# Patient Record
Sex: Female | Born: 1970 | Race: Black or African American | Hispanic: No | Marital: Single | Smoking: Current some day smoker
Health system: Southern US, Community
[De-identification: ages and names within clinical notes are randomized; demographics above are authoritative.]

## PROBLEM LIST (undated history)

## (undated) ENCOUNTER — Emergency Department (HOSPITAL_COMMUNITY): Disposition: A | Discharge status: Home / Self Care | Payer: Self-pay

## (undated) DIAGNOSIS — R768 Other specified abnormal immunological findings in serum: Secondary | ICD-10-CM

## (undated) DIAGNOSIS — M3214 Glomerular disease in systemic lupus erythematosus: Secondary | ICD-10-CM

## (undated) DIAGNOSIS — Z992 Dependence on renal dialysis: Secondary | ICD-10-CM

## (undated) DIAGNOSIS — H02059 Trichiasis without entropian unspecified eye, unspecified eyelid: Secondary | ICD-10-CM

## (undated) DIAGNOSIS — H902 Conductive hearing loss, unspecified: Secondary | ICD-10-CM

## (undated) DIAGNOSIS — O1490 Unspecified pre-eclampsia, unspecified trimester: Secondary | ICD-10-CM

## (undated) HISTORY — DX: Glomerular disease in systemic lupus erythematosus: M32.14

## (undated) HISTORY — DX: Unspecified pre-eclampsia, unspecified trimester: O14.90

## (undated) HISTORY — DX: Trichiasis without entropion unspecified eye, unspecified eyelid: H02.059

## (undated) HISTORY — DX: Other specified abnormal immunological findings in serum: R76.8

## (undated) HISTORY — PX: OTHER SURGICAL HISTORY: SHX169

## (undated) HISTORY — DX: Dependence on renal dialysis: Z99.2

## (undated) HISTORY — DX: Conductive hearing loss, unspecified: H90.2

---

## 1997-06-01 HISTORY — PX: RENAL BIOPSY: SHX156

## 1997-07-02 DIAGNOSIS — M3214 Glomerular disease in systemic lupus erythematosus: Secondary | ICD-10-CM

## 1997-07-02 HISTORY — DX: Glomerular disease in systemic lupus erythematosus: M32.14

## 1997-08-30 ENCOUNTER — Other Ambulatory Visit: Admission: RE | Admit: 1997-08-30 | Discharge: 1997-08-30 | Payer: Self-pay | Admitting: *Deleted

## 1997-08-30 ENCOUNTER — Encounter (HOSPITAL_COMMUNITY): Admission: RE | Admit: 1997-08-30 | Discharge: 1997-11-28 | Payer: Self-pay | Admitting: Nephrology

## 1997-09-04 ENCOUNTER — Other Ambulatory Visit: Admission: RE | Admit: 1997-09-04 | Discharge: 1997-09-04 | Payer: Self-pay | Admitting: *Deleted

## 1997-09-06 ENCOUNTER — Other Ambulatory Visit: Admission: RE | Admit: 1997-09-06 | Discharge: 1997-09-06 | Payer: Self-pay | Admitting: *Deleted

## 1997-09-11 ENCOUNTER — Other Ambulatory Visit: Admission: RE | Admit: 1997-09-11 | Discharge: 1997-09-11 | Payer: Self-pay | Admitting: Nephrology

## 1997-09-13 ENCOUNTER — Other Ambulatory Visit: Admission: RE | Admit: 1997-09-13 | Discharge: 1997-09-13 | Payer: Self-pay | Admitting: *Deleted

## 1997-09-18 ENCOUNTER — Other Ambulatory Visit: Admission: RE | Admit: 1997-09-18 | Discharge: 1997-09-18 | Payer: Self-pay | Admitting: *Deleted

## 1997-09-29 ENCOUNTER — Other Ambulatory Visit: Admission: RE | Admit: 1997-09-29 | Discharge: 1997-09-29 | Payer: Self-pay | Admitting: *Deleted

## 1997-10-02 ENCOUNTER — Other Ambulatory Visit: Admission: RE | Admit: 1997-10-02 | Discharge: 1997-10-02 | Payer: Self-pay | Admitting: Nephrology

## 1997-10-16 ENCOUNTER — Other Ambulatory Visit: Admission: RE | Admit: 1997-10-16 | Discharge: 1997-10-16 | Payer: Self-pay | Admitting: Nephrology

## 1997-11-03 ENCOUNTER — Other Ambulatory Visit: Admission: RE | Admit: 1997-11-03 | Discharge: 1997-11-03 | Payer: Self-pay | Admitting: Nephrology

## 1997-12-04 ENCOUNTER — Other Ambulatory Visit: Admission: RE | Admit: 1997-12-04 | Discharge: 1997-12-04 | Payer: Self-pay | Admitting: *Deleted

## 1997-12-12 ENCOUNTER — Ambulatory Visit (HOSPITAL_COMMUNITY): Admission: RE | Admit: 1997-12-12 | Discharge: 1997-12-12 | Payer: Self-pay | Admitting: *Deleted

## 1997-12-25 ENCOUNTER — Other Ambulatory Visit: Admission: RE | Admit: 1997-12-25 | Discharge: 1997-12-25 | Payer: Self-pay | Admitting: *Deleted

## 1997-12-27 ENCOUNTER — Other Ambulatory Visit: Admission: RE | Admit: 1997-12-27 | Discharge: 1997-12-27 | Payer: Self-pay | Admitting: *Deleted

## 1998-01-03 ENCOUNTER — Ambulatory Visit (HOSPITAL_COMMUNITY): Admission: RE | Admit: 1998-01-03 | Discharge: 1998-01-03 | Payer: Self-pay | Admitting: *Deleted

## 1998-02-18 ENCOUNTER — Ambulatory Visit (HOSPITAL_COMMUNITY): Admission: RE | Admit: 1998-02-18 | Discharge: 1998-02-18 | Payer: Self-pay | Admitting: Thoracic Surgery

## 1998-03-04 ENCOUNTER — Ambulatory Visit (HOSPITAL_COMMUNITY): Admission: RE | Admit: 1998-03-04 | Discharge: 1998-03-04 | Payer: Self-pay | Admitting: Thoracic Surgery

## 1998-03-06 ENCOUNTER — Ambulatory Visit (HOSPITAL_COMMUNITY): Admission: RE | Admit: 1998-03-06 | Discharge: 1998-03-06 | Payer: Self-pay | Admitting: Thoracic Surgery

## 1998-03-13 ENCOUNTER — Ambulatory Visit (HOSPITAL_COMMUNITY): Admission: RE | Admit: 1998-03-13 | Discharge: 1998-03-13 | Payer: Self-pay | Admitting: Nephrology

## 1998-03-28 ENCOUNTER — Ambulatory Visit (HOSPITAL_COMMUNITY): Admission: RE | Admit: 1998-03-28 | Discharge: 1998-03-28 | Payer: Self-pay | Admitting: Thoracic Surgery

## 1998-04-01 ENCOUNTER — Ambulatory Visit (HOSPITAL_COMMUNITY): Admission: RE | Admit: 1998-04-01 | Discharge: 1998-04-01 | Payer: Self-pay | Admitting: Thoracic Surgery

## 1998-04-10 ENCOUNTER — Ambulatory Visit (HOSPITAL_COMMUNITY): Admission: RE | Admit: 1998-04-10 | Discharge: 1998-04-10 | Payer: Self-pay | Admitting: Thoracic Surgery

## 1998-04-26 ENCOUNTER — Encounter: Payer: Self-pay | Admitting: *Deleted

## 1998-04-26 ENCOUNTER — Ambulatory Visit (HOSPITAL_COMMUNITY): Admission: RE | Admit: 1998-04-26 | Discharge: 1998-04-26 | Payer: Self-pay | Admitting: *Deleted

## 1998-04-29 ENCOUNTER — Ambulatory Visit (HOSPITAL_COMMUNITY): Admission: RE | Admit: 1998-04-29 | Discharge: 1998-04-29 | Payer: Self-pay | Admitting: Thoracic Surgery

## 1998-08-16 ENCOUNTER — Ambulatory Visit (HOSPITAL_COMMUNITY): Admission: RE | Admit: 1998-08-16 | Discharge: 1998-08-16 | Payer: Self-pay | Admitting: Nephrology

## 1998-08-20 ENCOUNTER — Ambulatory Visit (HOSPITAL_COMMUNITY): Admission: RE | Admit: 1998-08-20 | Discharge: 1998-08-20 | Payer: Self-pay | Admitting: Nephrology

## 1998-08-23 ENCOUNTER — Ambulatory Visit (HOSPITAL_COMMUNITY): Admission: RE | Admit: 1998-08-23 | Discharge: 1998-08-23 | Payer: Self-pay | Admitting: Nephrology

## 1998-08-23 ENCOUNTER — Encounter: Payer: Self-pay | Admitting: Nephrology

## 1998-09-02 ENCOUNTER — Ambulatory Visit (HOSPITAL_COMMUNITY): Admission: RE | Admit: 1998-09-02 | Discharge: 1998-09-02 | Payer: Self-pay | Admitting: *Deleted

## 1998-09-10 ENCOUNTER — Ambulatory Visit (HOSPITAL_COMMUNITY): Admission: RE | Admit: 1998-09-10 | Discharge: 1998-09-10 | Payer: Self-pay | Admitting: Nephrology

## 1998-10-28 ENCOUNTER — Encounter: Admission: RE | Admit: 1998-10-28 | Discharge: 1998-10-28 | Payer: Self-pay | Admitting: Sports Medicine

## 1998-11-22 ENCOUNTER — Ambulatory Visit (HOSPITAL_COMMUNITY): Admission: RE | Admit: 1998-11-22 | Discharge: 1998-11-22 | Payer: Self-pay | Admitting: Nephrology

## 1998-12-06 ENCOUNTER — Ambulatory Visit (HOSPITAL_COMMUNITY): Admission: RE | Admit: 1998-12-06 | Discharge: 1998-12-06 | Payer: Self-pay | Admitting: Vascular Surgery

## 1998-12-13 ENCOUNTER — Ambulatory Visit (HOSPITAL_COMMUNITY): Admission: RE | Admit: 1998-12-13 | Discharge: 1998-12-13 | Payer: Self-pay | Admitting: Nephrology

## 1999-01-01 ENCOUNTER — Ambulatory Visit (HOSPITAL_COMMUNITY): Admission: RE | Admit: 1999-01-01 | Discharge: 1999-01-01 | Payer: Self-pay | Admitting: Nephrology

## 1999-01-09 ENCOUNTER — Ambulatory Visit (HOSPITAL_COMMUNITY): Admission: RE | Admit: 1999-01-09 | Discharge: 1999-01-09 | Payer: Self-pay | Admitting: Vascular Surgery

## 1999-01-10 ENCOUNTER — Ambulatory Visit (HOSPITAL_COMMUNITY): Admission: RE | Admit: 1999-01-10 | Discharge: 1999-01-10 | Payer: Self-pay | Admitting: Nephrology

## 1999-01-10 ENCOUNTER — Encounter: Payer: Self-pay | Admitting: Nephrology

## 1999-01-11 ENCOUNTER — Ambulatory Visit (HOSPITAL_COMMUNITY): Admission: RE | Admit: 1999-01-11 | Discharge: 1999-01-11 | Payer: Self-pay | Admitting: Vascular Surgery

## 1999-01-11 ENCOUNTER — Encounter: Payer: Self-pay | Admitting: Vascular Surgery

## 1999-01-13 ENCOUNTER — Ambulatory Visit (HOSPITAL_COMMUNITY): Admission: RE | Admit: 1999-01-13 | Discharge: 1999-01-13 | Payer: Self-pay | Admitting: Nephrology

## 1999-01-14 ENCOUNTER — Encounter: Payer: Self-pay | Admitting: Nephrology

## 1999-01-14 ENCOUNTER — Ambulatory Visit (HOSPITAL_COMMUNITY): Admission: RE | Admit: 1999-01-14 | Discharge: 1999-01-14 | Payer: Self-pay | Admitting: Nephrology

## 1999-02-19 ENCOUNTER — Ambulatory Visit (HOSPITAL_COMMUNITY): Admission: RE | Admit: 1999-02-19 | Discharge: 1999-02-19 | Payer: Self-pay | Admitting: Nephrology

## 1999-07-10 ENCOUNTER — Inpatient Hospital Stay (HOSPITAL_COMMUNITY): Admission: EM | Admit: 1999-07-10 | Discharge: 1999-07-11 | Payer: Self-pay | Admitting: Emergency Medicine

## 1999-07-11 ENCOUNTER — Encounter: Payer: Self-pay | Admitting: Nephrology

## 1999-07-14 ENCOUNTER — Ambulatory Visit (HOSPITAL_COMMUNITY): Admission: RE | Admit: 1999-07-14 | Discharge: 1999-07-14 | Payer: Self-pay | Admitting: Nephrology

## 1999-07-14 ENCOUNTER — Encounter: Payer: Self-pay | Admitting: Nephrology

## 1999-09-04 ENCOUNTER — Encounter: Payer: Self-pay | Admitting: Nephrology

## 1999-09-04 ENCOUNTER — Inpatient Hospital Stay (HOSPITAL_COMMUNITY): Admission: AD | Admit: 1999-09-04 | Discharge: 1999-09-09 | Payer: Self-pay | Admitting: Nephrology

## 1999-09-08 ENCOUNTER — Encounter: Payer: Self-pay | Admitting: Nephrology

## 1999-10-27 ENCOUNTER — Other Ambulatory Visit: Admission: RE | Admit: 1999-10-27 | Discharge: 1999-10-27 | Payer: Self-pay | Admitting: Family Medicine

## 1999-10-27 ENCOUNTER — Encounter: Admission: RE | Admit: 1999-10-27 | Discharge: 1999-10-27 | Payer: Self-pay | Admitting: Family Medicine

## 1999-12-10 ENCOUNTER — Ambulatory Visit (HOSPITAL_COMMUNITY): Admission: RE | Admit: 1999-12-10 | Discharge: 1999-12-10 | Payer: Self-pay | Admitting: Nephrology

## 1999-12-10 ENCOUNTER — Encounter: Payer: Self-pay | Admitting: Nephrology

## 1999-12-17 ENCOUNTER — Ambulatory Visit (HOSPITAL_COMMUNITY): Admission: RE | Admit: 1999-12-17 | Discharge: 1999-12-17 | Payer: Self-pay | Admitting: Nephrology

## 1999-12-23 ENCOUNTER — Encounter: Payer: Self-pay | Admitting: Nephrology

## 1999-12-23 ENCOUNTER — Inpatient Hospital Stay (HOSPITAL_COMMUNITY): Admission: EM | Admit: 1999-12-23 | Discharge: 2000-01-02 | Payer: Self-pay | Admitting: *Deleted

## 1999-12-24 ENCOUNTER — Encounter: Payer: Self-pay | Admitting: Nephrology

## 2000-01-01 ENCOUNTER — Encounter: Payer: Self-pay | Admitting: Nephrology

## 2000-01-08 ENCOUNTER — Emergency Department (HOSPITAL_COMMUNITY): Admission: EM | Admit: 2000-01-08 | Discharge: 2000-01-08 | Payer: Self-pay | Admitting: Emergency Medicine

## 2000-01-16 ENCOUNTER — Ambulatory Visit (HOSPITAL_COMMUNITY): Admission: RE | Admit: 2000-01-16 | Discharge: 2000-01-16 | Payer: Self-pay | Admitting: Nephrology

## 2000-01-16 ENCOUNTER — Encounter: Payer: Self-pay | Admitting: Nephrology

## 2000-01-28 ENCOUNTER — Ambulatory Visit (HOSPITAL_COMMUNITY): Admission: RE | Admit: 2000-01-28 | Discharge: 2000-01-28 | Payer: Self-pay | Admitting: Nephrology

## 2000-01-28 ENCOUNTER — Encounter: Payer: Self-pay | Admitting: Nephrology

## 2000-02-11 ENCOUNTER — Ambulatory Visit (HOSPITAL_COMMUNITY): Admission: RE | Admit: 2000-02-11 | Discharge: 2000-02-12 | Payer: Self-pay | Admitting: General Surgery

## 2000-04-14 ENCOUNTER — Encounter: Payer: Self-pay | Admitting: Nephrology

## 2000-04-14 ENCOUNTER — Ambulatory Visit (HOSPITAL_COMMUNITY): Admission: RE | Admit: 2000-04-14 | Discharge: 2000-04-14 | Payer: Self-pay | Admitting: Nephrology

## 2000-10-16 ENCOUNTER — Encounter: Payer: Self-pay | Admitting: Emergency Medicine

## 2000-10-16 ENCOUNTER — Emergency Department (HOSPITAL_COMMUNITY): Admission: EM | Admit: 2000-10-16 | Discharge: 2000-10-16 | Payer: Self-pay | Admitting: *Deleted

## 2001-01-09 ENCOUNTER — Inpatient Hospital Stay (HOSPITAL_COMMUNITY): Admission: EM | Admit: 2001-01-09 | Discharge: 2001-01-14 | Payer: Self-pay

## 2001-01-10 ENCOUNTER — Encounter: Payer: Self-pay | Admitting: Nephrology

## 2001-01-11 ENCOUNTER — Encounter: Payer: Self-pay | Admitting: Cardiovascular Disease

## 2001-07-11 ENCOUNTER — Encounter: Admission: RE | Admit: 2001-07-11 | Discharge: 2001-07-11 | Payer: Self-pay | Admitting: Family Medicine

## 2001-08-11 ENCOUNTER — Emergency Department (HOSPITAL_COMMUNITY): Admission: EM | Admit: 2001-08-11 | Discharge: 2001-08-11 | Payer: Self-pay | Admitting: Emergency Medicine

## 2001-10-28 ENCOUNTER — Encounter: Admission: RE | Admit: 2001-10-28 | Discharge: 2001-10-28 | Payer: Self-pay | Admitting: Family Medicine

## 2001-11-02 ENCOUNTER — Encounter: Payer: Self-pay | Admitting: Emergency Medicine

## 2001-11-02 ENCOUNTER — Inpatient Hospital Stay (HOSPITAL_COMMUNITY): Admission: EM | Admit: 2001-11-02 | Discharge: 2001-11-05 | Payer: Self-pay | Admitting: Emergency Medicine

## 2001-11-03 ENCOUNTER — Encounter: Payer: Self-pay | Admitting: Nephrology

## 2002-09-25 ENCOUNTER — Inpatient Hospital Stay (HOSPITAL_COMMUNITY): Admission: EM | Admit: 2002-09-25 | Discharge: 2002-10-04 | Payer: Self-pay | Admitting: Emergency Medicine

## 2002-09-25 ENCOUNTER — Encounter: Payer: Self-pay | Admitting: Emergency Medicine

## 2002-09-28 ENCOUNTER — Encounter: Payer: Self-pay | Admitting: Nephrology

## 2002-09-30 ENCOUNTER — Encounter: Payer: Self-pay | Admitting: Nephrology

## 2002-10-01 ENCOUNTER — Encounter: Payer: Self-pay | Admitting: Nephrology

## 2002-10-04 ENCOUNTER — Encounter: Payer: Self-pay | Admitting: Nephrology

## 2002-11-23 ENCOUNTER — Encounter: Payer: Self-pay | Admitting: Nephrology

## 2002-11-23 ENCOUNTER — Encounter: Admission: RE | Admit: 2002-11-23 | Discharge: 2002-11-23 | Payer: Self-pay | Admitting: Nephrology

## 2002-12-19 ENCOUNTER — Encounter: Payer: Self-pay | Admitting: Emergency Medicine

## 2002-12-19 ENCOUNTER — Emergency Department (HOSPITAL_COMMUNITY): Admission: EM | Admit: 2002-12-19 | Discharge: 2002-12-19 | Payer: Self-pay | Admitting: Emergency Medicine

## 2003-03-02 DIAGNOSIS — H902 Conductive hearing loss, unspecified: Secondary | ICD-10-CM

## 2003-03-02 HISTORY — DX: Conductive hearing loss, unspecified: H90.2

## 2003-03-30 ENCOUNTER — Emergency Department (HOSPITAL_COMMUNITY): Admission: EM | Admit: 2003-03-30 | Discharge: 2003-03-31 | Payer: Self-pay | Admitting: Emergency Medicine

## 2003-08-31 ENCOUNTER — Encounter (INDEPENDENT_AMBULATORY_CARE_PROVIDER_SITE_OTHER): Payer: Self-pay | Admitting: *Deleted

## 2003-09-13 ENCOUNTER — Encounter: Admission: RE | Admit: 2003-09-13 | Discharge: 2003-09-13 | Payer: Self-pay | Admitting: Family Medicine

## 2004-03-29 ENCOUNTER — Emergency Department (HOSPITAL_COMMUNITY): Admission: EM | Admit: 2004-03-29 | Discharge: 2004-03-29 | Payer: Self-pay | Admitting: Emergency Medicine

## 2004-07-11 ENCOUNTER — Inpatient Hospital Stay (HOSPITAL_COMMUNITY): Admission: AC | Admit: 2004-07-11 | Discharge: 2004-07-25 | Payer: Self-pay

## 2004-07-11 ENCOUNTER — Ambulatory Visit: Payer: Self-pay | Admitting: Internal Medicine

## 2004-09-27 ENCOUNTER — Inpatient Hospital Stay (HOSPITAL_COMMUNITY): Admission: EM | Admit: 2004-09-27 | Discharge: 2004-09-30 | Payer: Self-pay | Admitting: Family Medicine

## 2005-03-02 ENCOUNTER — Ambulatory Visit: Payer: Self-pay | Admitting: Internal Medicine

## 2005-03-02 ENCOUNTER — Inpatient Hospital Stay (HOSPITAL_COMMUNITY): Admission: AD | Admit: 2005-03-02 | Discharge: 2005-03-17 | Payer: Self-pay | Admitting: Nephrology

## 2005-04-05 ENCOUNTER — Emergency Department (HOSPITAL_COMMUNITY): Admission: EM | Admit: 2005-04-05 | Discharge: 2005-04-05 | Payer: Self-pay | Admitting: Emergency Medicine

## 2005-05-29 ENCOUNTER — Ambulatory Visit (HOSPITAL_COMMUNITY): Admission: RE | Admit: 2005-05-29 | Discharge: 2005-05-29 | Payer: Self-pay | Admitting: Nephrology

## 2005-07-03 ENCOUNTER — Ambulatory Visit (HOSPITAL_COMMUNITY): Admission: RE | Admit: 2005-07-03 | Discharge: 2005-07-03 | Payer: Self-pay | Admitting: Nephrology

## 2005-07-24 ENCOUNTER — Encounter: Admission: RE | Admit: 2005-07-24 | Discharge: 2005-07-24 | Payer: Self-pay | Admitting: Nephrology

## 2005-08-03 ENCOUNTER — Encounter: Admission: RE | Admit: 2005-08-03 | Discharge: 2005-08-03 | Payer: Self-pay | Admitting: Nephrology

## 2005-08-05 ENCOUNTER — Ambulatory Visit (HOSPITAL_COMMUNITY): Admission: RE | Admit: 2005-08-05 | Discharge: 2005-08-05 | Payer: Self-pay | Admitting: Nephrology

## 2005-08-10 ENCOUNTER — Ambulatory Visit (HOSPITAL_COMMUNITY): Admission: RE | Admit: 2005-08-10 | Discharge: 2005-08-10 | Payer: Self-pay | Admitting: Nephrology

## 2005-08-24 ENCOUNTER — Other Ambulatory Visit: Admission: RE | Admit: 2005-08-24 | Discharge: 2005-08-24 | Payer: Self-pay | Admitting: Obstetrics and Gynecology

## 2005-10-12 ENCOUNTER — Encounter (INDEPENDENT_AMBULATORY_CARE_PROVIDER_SITE_OTHER): Payer: Self-pay | Admitting: Specialist

## 2005-10-12 ENCOUNTER — Inpatient Hospital Stay (HOSPITAL_COMMUNITY): Admission: AD | Admit: 2005-10-12 | Discharge: 2005-10-20 | Payer: Self-pay | Admitting: General Surgery

## 2006-01-01 ENCOUNTER — Ambulatory Visit (HOSPITAL_COMMUNITY): Admission: RE | Admit: 2006-01-01 | Discharge: 2006-01-01 | Payer: Self-pay | Admitting: Nephrology

## 2006-04-23 ENCOUNTER — Ambulatory Visit: Payer: Self-pay | Admitting: Infectious Diseases

## 2006-04-28 ENCOUNTER — Ambulatory Visit (HOSPITAL_COMMUNITY): Admission: RE | Admit: 2006-04-28 | Discharge: 2006-04-28 | Payer: Self-pay | Admitting: Nephrology

## 2006-05-01 ENCOUNTER — Ambulatory Visit (HOSPITAL_COMMUNITY): Admission: RE | Admit: 2006-05-01 | Discharge: 2006-05-01 | Payer: Self-pay | Admitting: Nephrology

## 2006-05-02 ENCOUNTER — Ambulatory Visit: Payer: Self-pay | Admitting: Critical Care Medicine

## 2006-05-02 ENCOUNTER — Inpatient Hospital Stay (HOSPITAL_COMMUNITY): Admission: EM | Admit: 2006-05-02 | Discharge: 2006-05-18 | Payer: Self-pay | Admitting: Emergency Medicine

## 2006-05-04 ENCOUNTER — Encounter: Payer: Self-pay | Admitting: Vascular Surgery

## 2006-05-08 ENCOUNTER — Ambulatory Visit: Payer: Self-pay | Admitting: Oncology

## 2006-05-09 ENCOUNTER — Encounter (INDEPENDENT_AMBULATORY_CARE_PROVIDER_SITE_OTHER): Payer: Self-pay | Admitting: *Deleted

## 2006-05-09 ENCOUNTER — Encounter: Payer: Self-pay | Admitting: Vascular Surgery

## 2006-06-15 ENCOUNTER — Ambulatory Visit (HOSPITAL_COMMUNITY): Admission: RE | Admit: 2006-06-15 | Discharge: 2006-06-15 | Payer: Self-pay | Admitting: Nephrology

## 2006-07-29 DIAGNOSIS — M329 Systemic lupus erythematosus, unspecified: Secondary | ICD-10-CM | POA: Insufficient documentation

## 2006-07-29 DIAGNOSIS — D649 Anemia, unspecified: Secondary | ICD-10-CM | POA: Insufficient documentation

## 2006-07-29 DIAGNOSIS — N19 Unspecified kidney failure: Secondary | ICD-10-CM | POA: Insufficient documentation

## 2006-07-29 DIAGNOSIS — R000 Tachycardia, unspecified: Secondary | ICD-10-CM | POA: Insufficient documentation

## 2006-07-30 ENCOUNTER — Encounter (INDEPENDENT_AMBULATORY_CARE_PROVIDER_SITE_OTHER): Payer: Self-pay | Admitting: *Deleted

## 2006-08-03 ENCOUNTER — Inpatient Hospital Stay (HOSPITAL_COMMUNITY): Admission: EM | Admit: 2006-08-03 | Discharge: 2006-08-14 | Payer: Self-pay | Admitting: Emergency Medicine

## 2006-10-06 ENCOUNTER — Ambulatory Visit (HOSPITAL_COMMUNITY): Admission: RE | Admit: 2006-10-06 | Discharge: 2006-10-06 | Payer: Self-pay | Admitting: Nephrology

## 2006-10-12 ENCOUNTER — Telehealth: Payer: Self-pay | Admitting: *Deleted

## 2006-10-14 ENCOUNTER — Encounter: Payer: Self-pay | Admitting: *Deleted

## 2006-12-04 ENCOUNTER — Inpatient Hospital Stay (HOSPITAL_COMMUNITY): Admission: EM | Admit: 2006-12-04 | Discharge: 2006-12-15 | Payer: Self-pay | Admitting: Emergency Medicine

## 2006-12-06 ENCOUNTER — Ambulatory Visit: Payer: Self-pay | Admitting: Infectious Diseases

## 2006-12-06 ENCOUNTER — Encounter (INDEPENDENT_AMBULATORY_CARE_PROVIDER_SITE_OTHER): Payer: Self-pay | Admitting: Nephrology

## 2006-12-07 ENCOUNTER — Encounter (INDEPENDENT_AMBULATORY_CARE_PROVIDER_SITE_OTHER): Payer: Self-pay | Admitting: Nephrology

## 2006-12-07 ENCOUNTER — Ambulatory Visit: Payer: Self-pay | Admitting: Vascular Surgery

## 2007-01-05 ENCOUNTER — Ambulatory Visit (HOSPITAL_COMMUNITY): Admission: RE | Admit: 2007-01-05 | Discharge: 2007-01-05 | Payer: Self-pay | Admitting: Nephrology

## 2007-01-07 ENCOUNTER — Ambulatory Visit (HOSPITAL_COMMUNITY): Admission: RE | Admit: 2007-01-07 | Discharge: 2007-01-07 | Payer: Self-pay | Admitting: Nephrology

## 2007-01-24 ENCOUNTER — Ambulatory Visit: Payer: Self-pay | Admitting: Cardiology

## 2007-02-04 ENCOUNTER — Ambulatory Visit: Payer: Self-pay

## 2007-02-21 IMAGING — CR DG CHEST 2V
2 series · 2 of 2 positions shown · non-contrast
Comparison: 09/29/04.

CLINICAL DATA: End stage renal disease.
 CHEST- 2 VIEW:

[w chest pa]
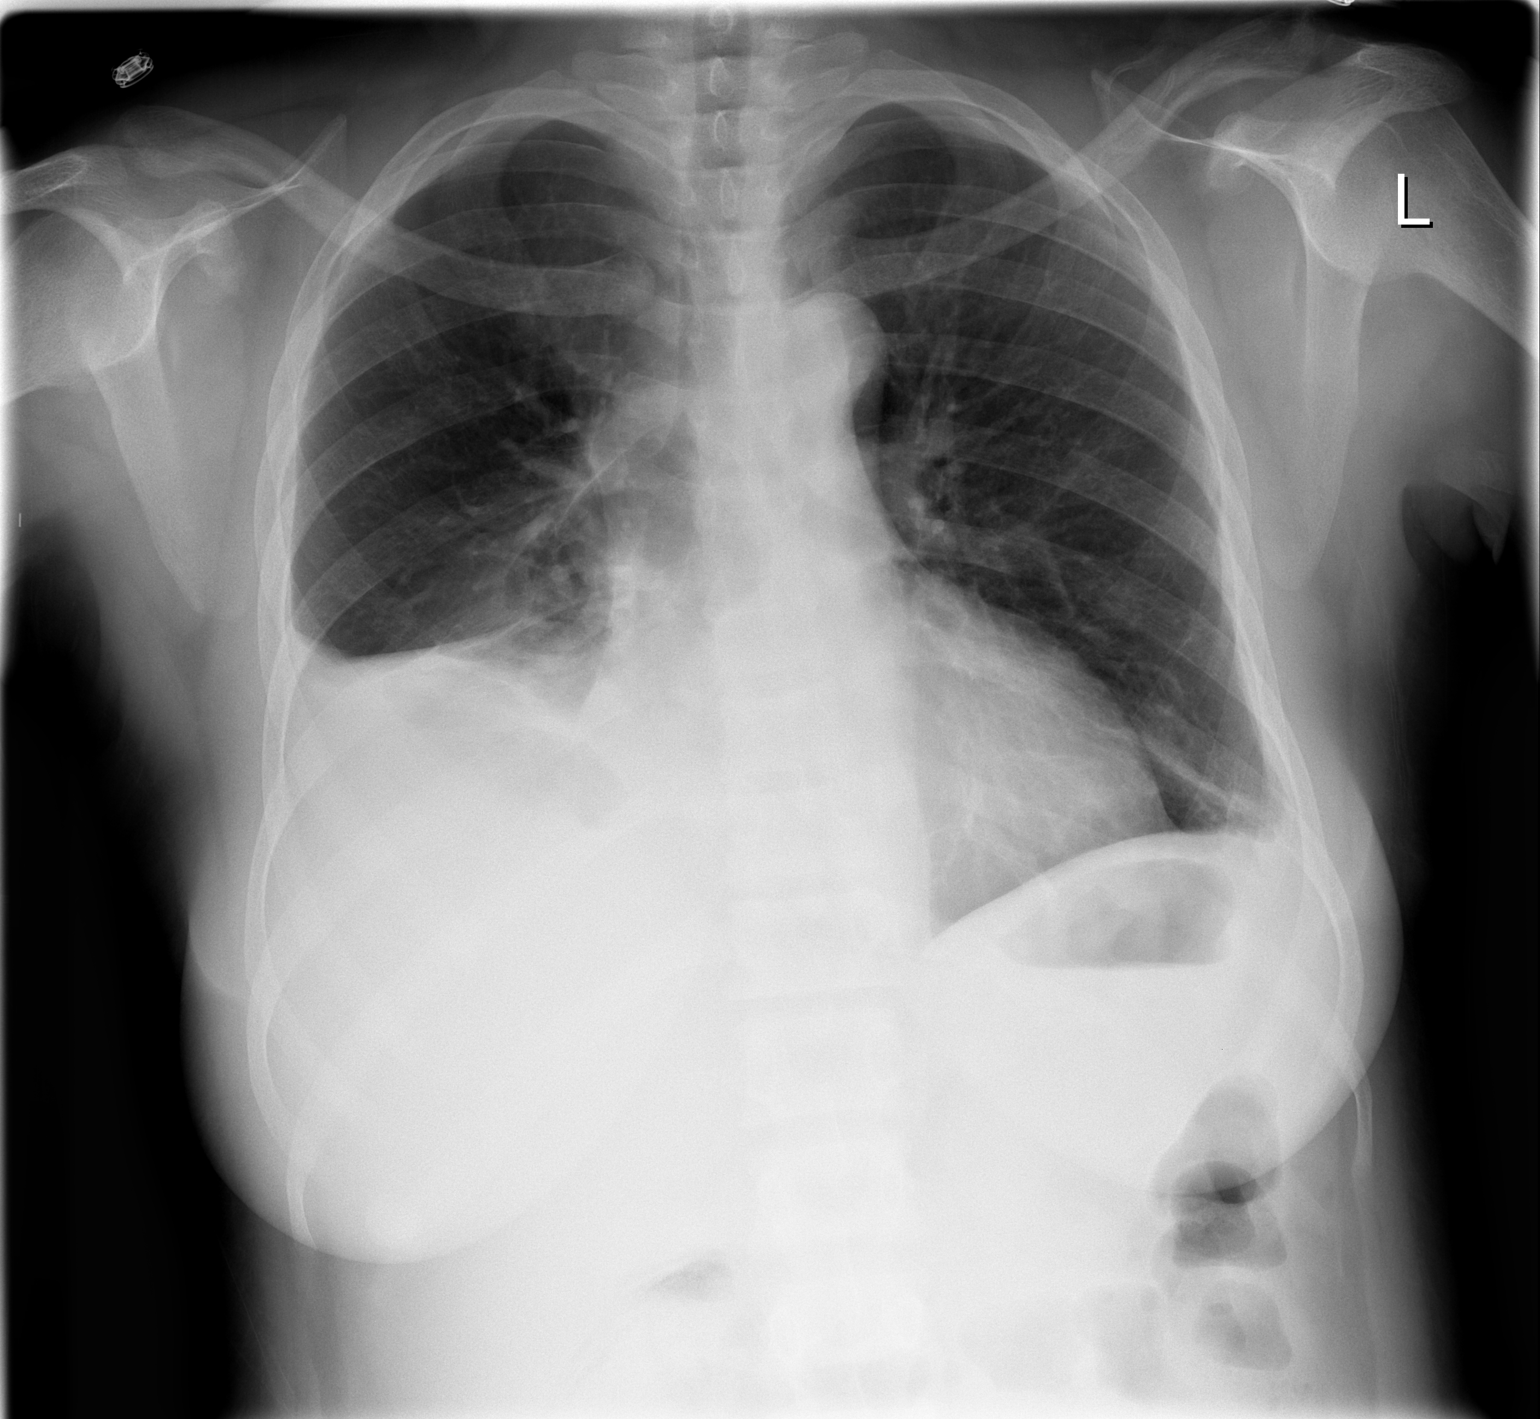

[w chest lat]
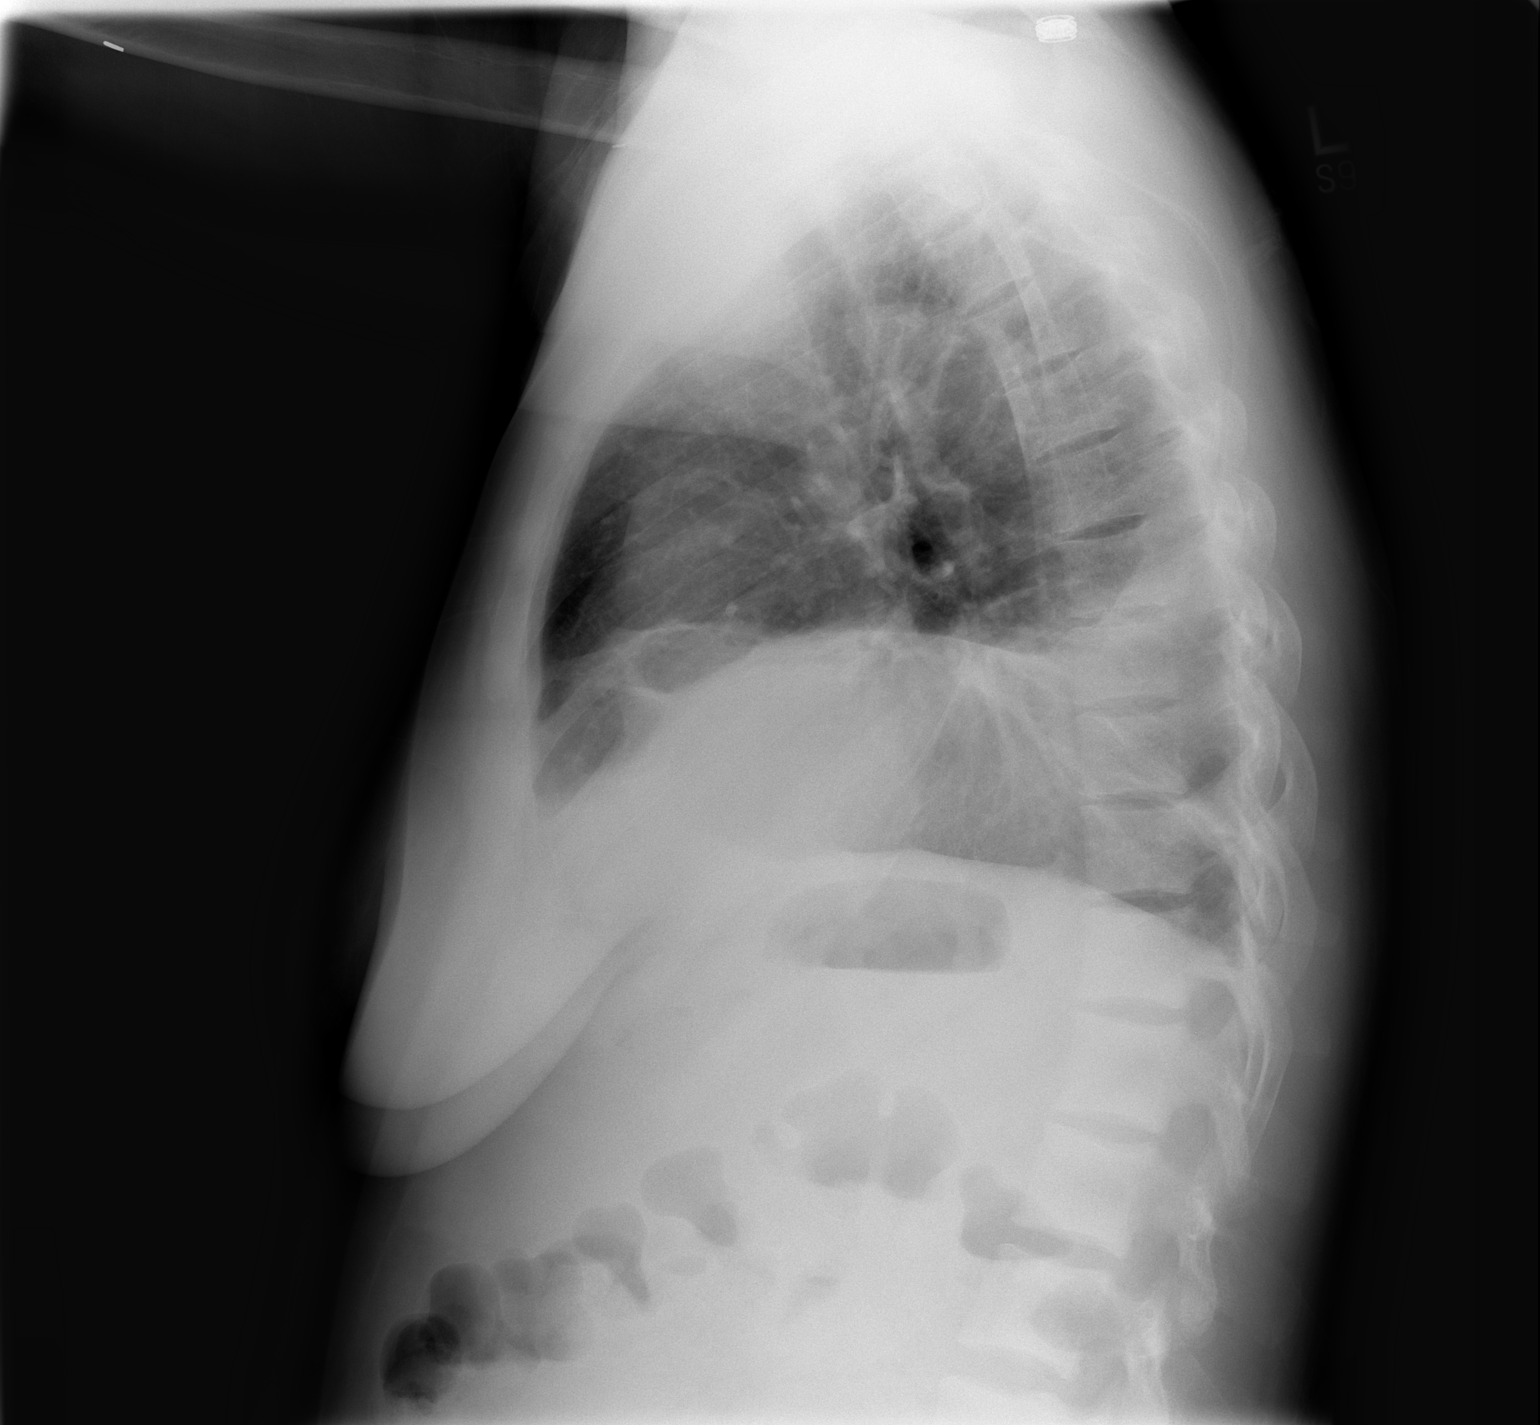

[2 of 2 positions shown; findings below may reference images not displayed]

FINDINGS: Increasing right pleural effusion and right lower lung atelectasis noted.  Small left effusion and left basilar atelectasis again noted.  Cardiomegaly and mild vascular congestion is present without edema.
IMPRESSION: 1.  Increasing right pleural effusion and right lower lung atelectasis.
 2.  Stable small left effusion and left basilar atelectasis/scarring.

## 2007-02-28 IMAGING — US US PARACENTESIS
1 series · 6 of 6 positions shown · non-contrast
Comparison: none

CLINICAL DATA: Large right pleural effusion.  Request has been made for diagnostic and therapeutic thoracentesis. 
ULTRASOUND-GUIDED RIGHT THORACENTESIS:
An ultrasound-guided thoracentesis was thoroughly discussed with the patient and questions answered. The benefits, risks, alternatives, and complications were also discussed. The patient understands and wishes to proceed with the procedure. A verbal as well as written consent was obtained.

[Series 1: unknown · 0.32mm/px · 6 of 6 slices shown]
[im 1/6]
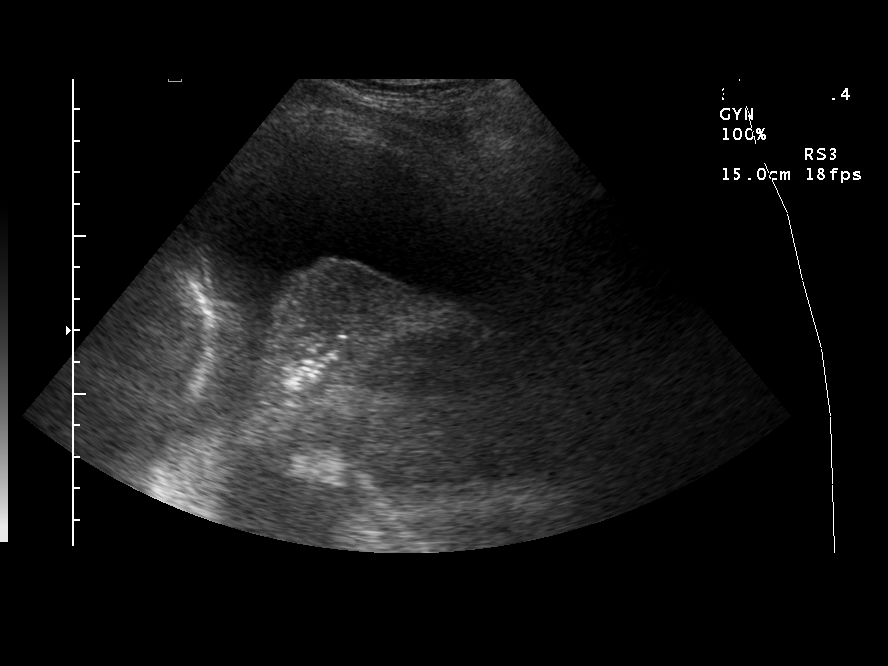
[im 2/6]
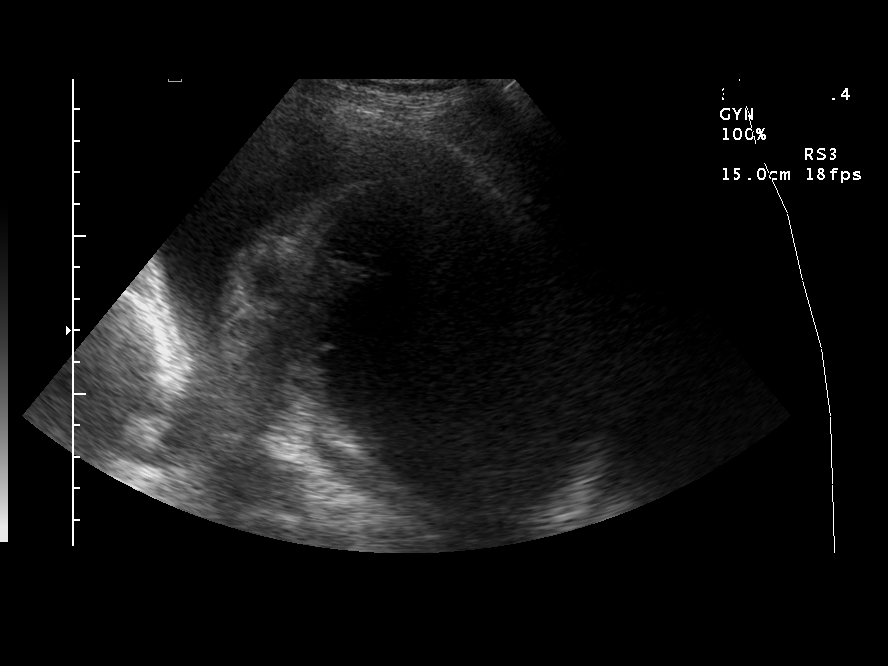
[im 3/6]
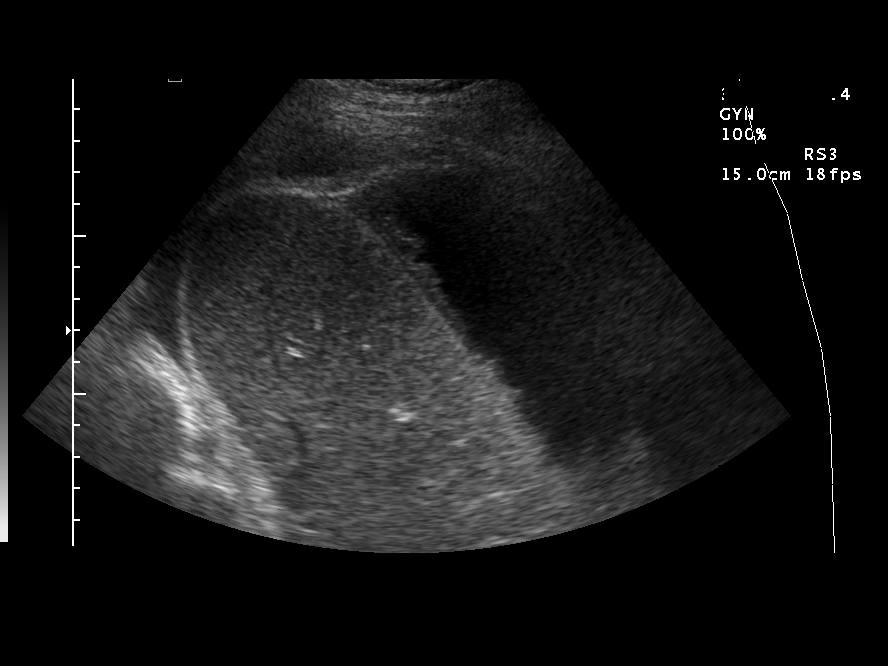
[im 4/6]
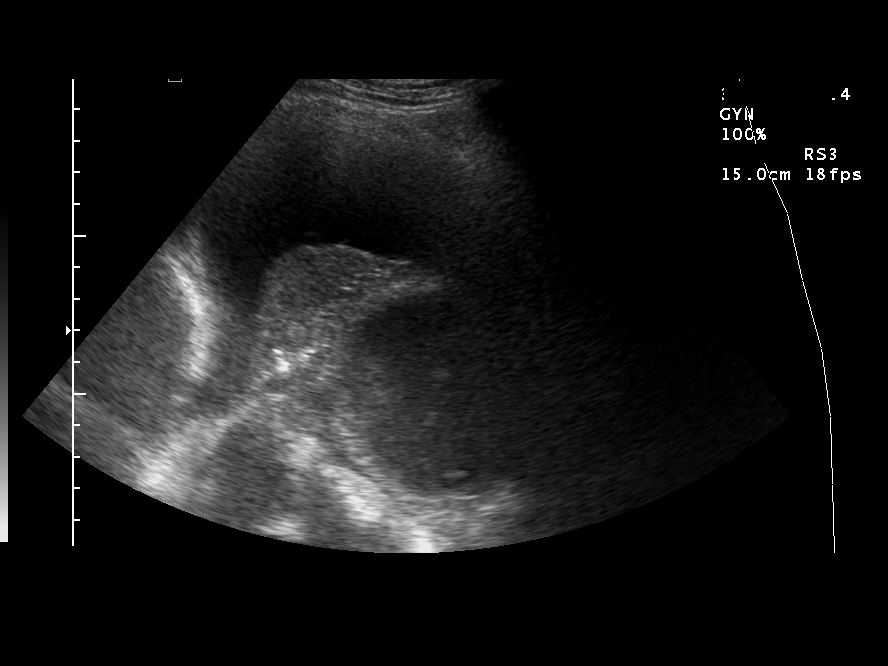
[im 5/6]
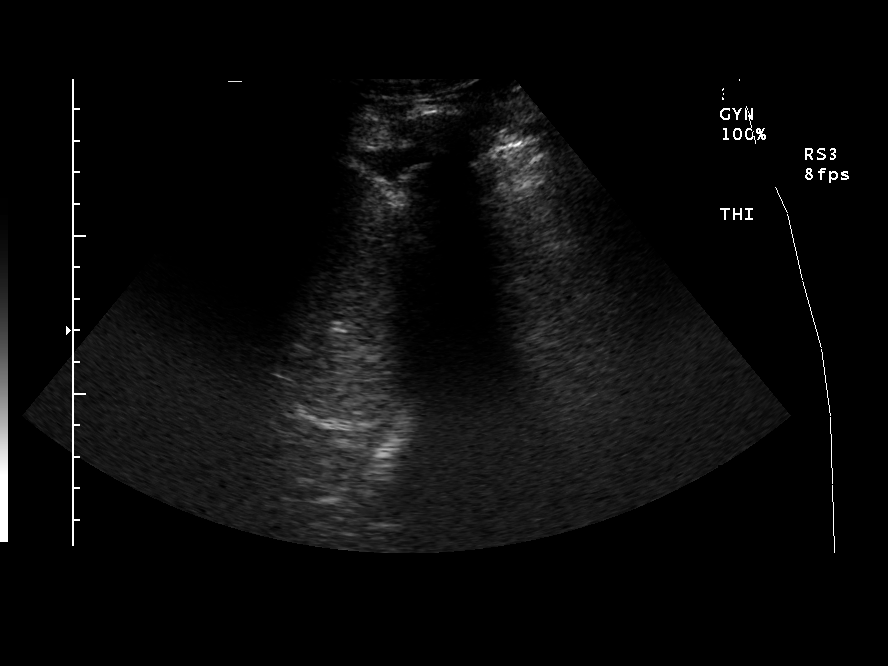
[im 6/6]
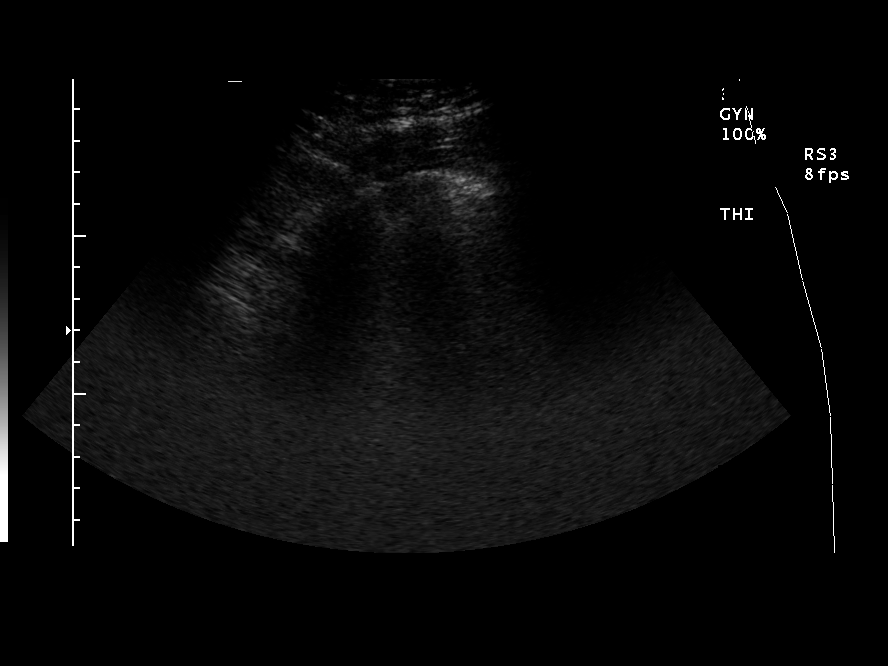

[6 of 6 positions shown; findings below may reference images not displayed]

Ultrasound was performed to localize and mark an adequate pocket of fluid for thoracentesis. The right chest wall was prepped and draped in the normal sterile fashion. 1% Lidocaine was used for local anesthesia. Under ultrasound guidance, a 19-gauge Yueh catheter was introduced yielding approximately 680 cc of cloudy amber fluid. The patient tolerated the procedure well and there were no immediate complications.  At the end, additional imaging was obtained with ultrasound, and there is what appears to be a loculated effusion in the lower right chest cavity. 
Post procedure chest x-ray is pending.
IMPRESSION: Successful ultrasound-guided right thoracentesis yielding 680 cc of cloudy amber fluid.

## 2007-03-04 IMAGING — CR DG CHEST 1V PORT
1 series · 1 of 1 positions shown · non-contrast
Comparison: One view chest x-ray 03/09/2005.

CLINICAL DATA: Attempted right IJ dialysis catheter placement.

PORTABLE CHEST - 1 VIEW  [DATE]/0338 [id number] hours:

[view not recorded]
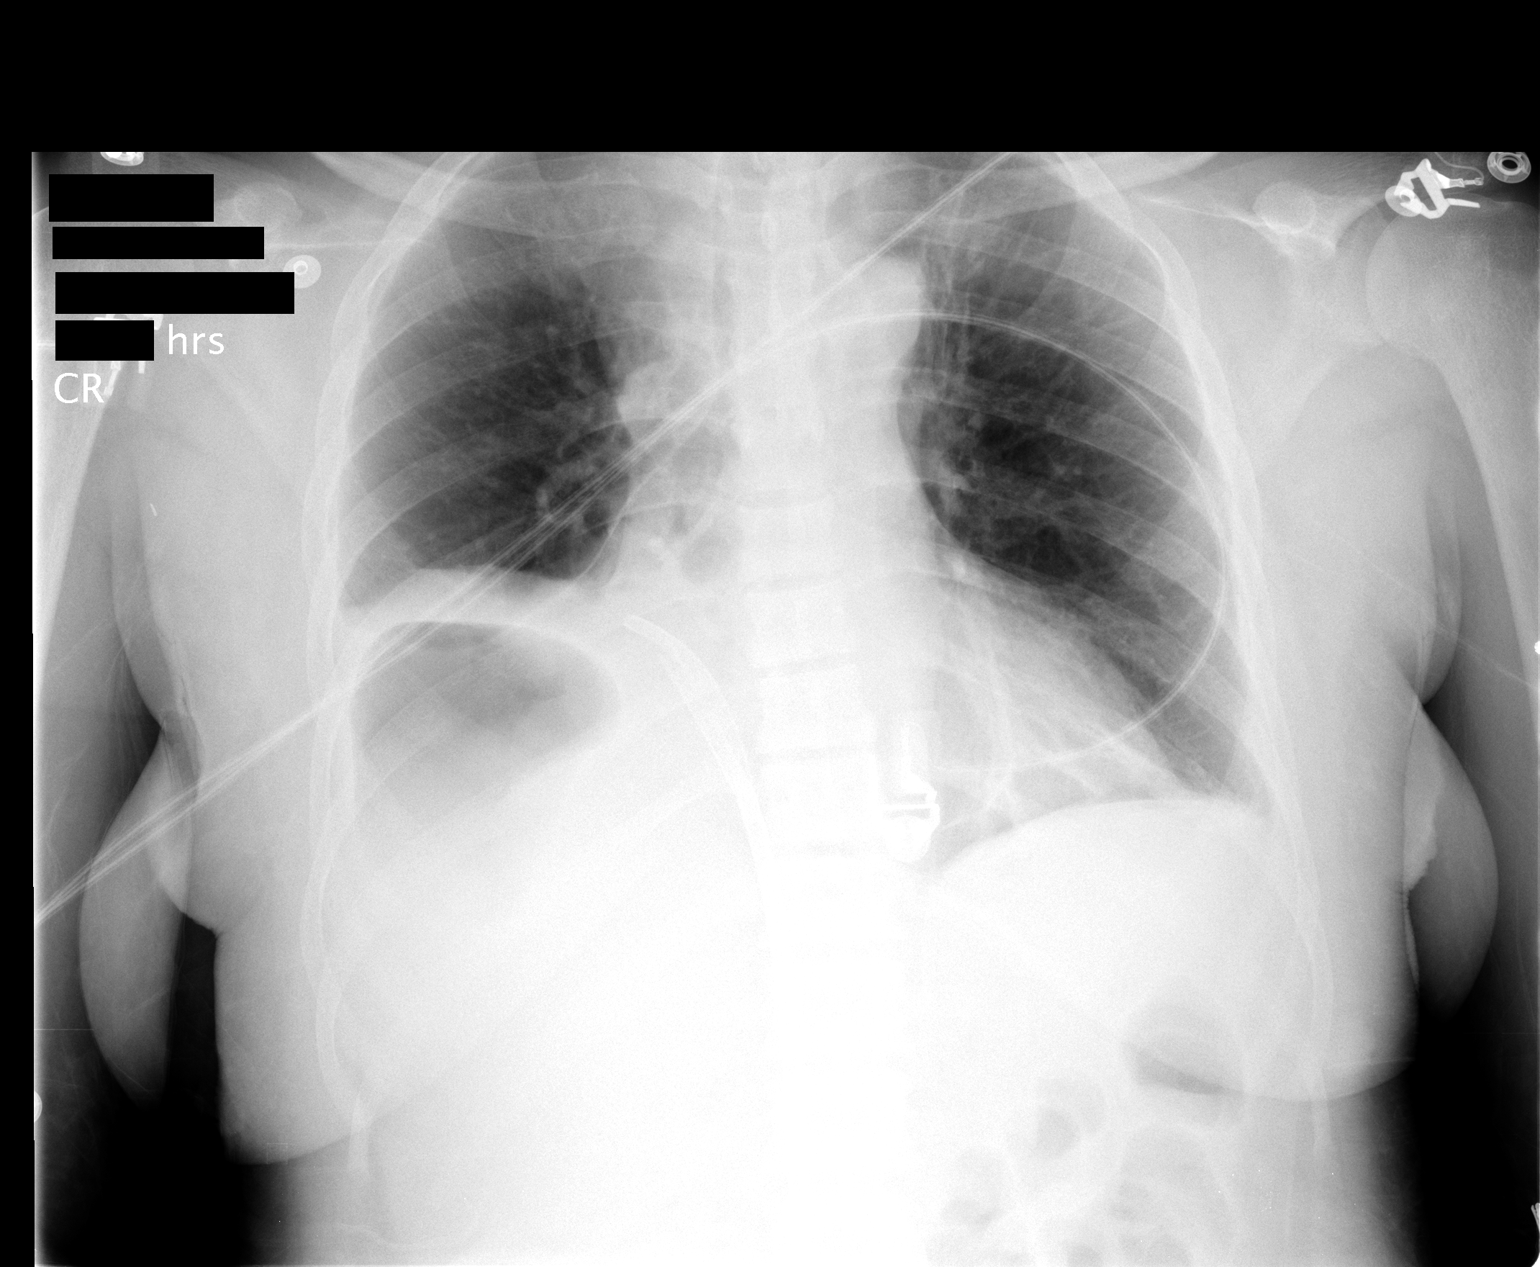

[1 of 1 positions shown; findings below may reference images not displayed]

FINDINGS: There is no evidence of a right pneumothorax after attempted right
internal jugular catheter placement. There is no evidence of mediastinal
hematoma. Prominent upper mediastinal fat is again noted and is unchanged from
the previous exam. Vertically oriented linear atelectasis is present at the left
base, new since the previous examination. Elevation of the right hemidiaphragm
is unchanged. The femoral dialysis catheter tips remain in the right atrium.
There is interposition of the hepatic flexure of the colon between the liver and
diaphragm.
IMPRESSION: 1. No acute complicating features after attempted right internal jugular
dialysis catheter placement.
2. Linear atelectasis in the left lung base. Elevation of the right
hemidiaphragm as before. No acute cardiopulmonary disease otherwise.

## 2007-05-10 ENCOUNTER — Emergency Department (HOSPITAL_COMMUNITY): Admission: EM | Admit: 2007-05-10 | Discharge: 2007-05-10 | Payer: Self-pay | Admitting: Emergency Medicine

## 2007-07-17 ENCOUNTER — Ambulatory Visit (HOSPITAL_COMMUNITY): Admission: RE | Admit: 2007-07-17 | Discharge: 2007-07-17 | Payer: Self-pay | Admitting: *Deleted

## 2007-07-22 ENCOUNTER — Encounter: Admission: RE | Admit: 2007-07-22 | Discharge: 2007-07-22 | Payer: Self-pay | Admitting: Neurological Surgery

## 2007-08-30 ENCOUNTER — Telehealth: Payer: Self-pay | Admitting: *Deleted

## 2007-08-31 ENCOUNTER — Encounter (INDEPENDENT_AMBULATORY_CARE_PROVIDER_SITE_OTHER): Payer: Self-pay | Admitting: Family Medicine

## 2007-08-31 ENCOUNTER — Ambulatory Visit: Payer: Self-pay | Admitting: Family Medicine

## 2007-08-31 DIAGNOSIS — F17200 Nicotine dependence, unspecified, uncomplicated: Secondary | ICD-10-CM | POA: Insufficient documentation

## 2007-08-31 DIAGNOSIS — N898 Other specified noninflammatory disorders of vagina: Secondary | ICD-10-CM | POA: Insufficient documentation

## 2007-08-31 DIAGNOSIS — N970 Female infertility associated with anovulation: Secondary | ICD-10-CM | POA: Insufficient documentation

## 2007-09-01 LAB — CONVERTED CEMR LAB: GC Probe Amp, Genital: NEGATIVE

## 2007-09-13 ENCOUNTER — Encounter: Admission: RE | Admit: 2007-09-13 | Discharge: 2007-09-13 | Payer: Self-pay | Admitting: Nephrology

## 2008-02-24 ENCOUNTER — Ambulatory Visit (HOSPITAL_COMMUNITY): Admission: RE | Admit: 2008-02-24 | Discharge: 2008-02-24 | Payer: Self-pay | Admitting: Nephrology

## 2008-04-12 ENCOUNTER — Inpatient Hospital Stay (HOSPITAL_COMMUNITY): Admission: EM | Admit: 2008-04-12 | Discharge: 2008-04-19 | Payer: Self-pay | Admitting: Emergency Medicine

## 2008-04-12 ENCOUNTER — Ambulatory Visit: Payer: Self-pay | Admitting: Cardiology

## 2008-04-18 ENCOUNTER — Encounter (INDEPENDENT_AMBULATORY_CARE_PROVIDER_SITE_OTHER): Payer: Self-pay | Admitting: Nephrology

## 2008-04-18 IMAGING — XA IR FLUORO GUIDE CV LINE*R*
1 series · 4 of 4 positions shown · non-contrast
Comparison: none

CLINICAL DATA: Cuff has been pulled.  
 FLUOROSCOPIC GUIDANCE FOR RIGHT FEMORAL VENOUS TUNNELED DIALYSIS CATHETER EXCHANGE 04/28/06 AT 2599 HOURS:
 Procedure:  The right groin dialysis catheter insertion site was prepped and draped in a sterile fashion.  Lidocaine was utilized for local anesthesia.  The cuff is visible at the dialysis catheter insertion site.  The catheter was cut and exchanged over an Amplatz wire for a new 42 cm tip-to-cuff dialysis catheter.  The cuff was positioned in the underlying subcutaneous tissue.  An 0 Prolene pursestring stitch was placed.  It was flushed.  No complications.

[Series 1: run · 4 of 4 slices shown]
[im 1/4]
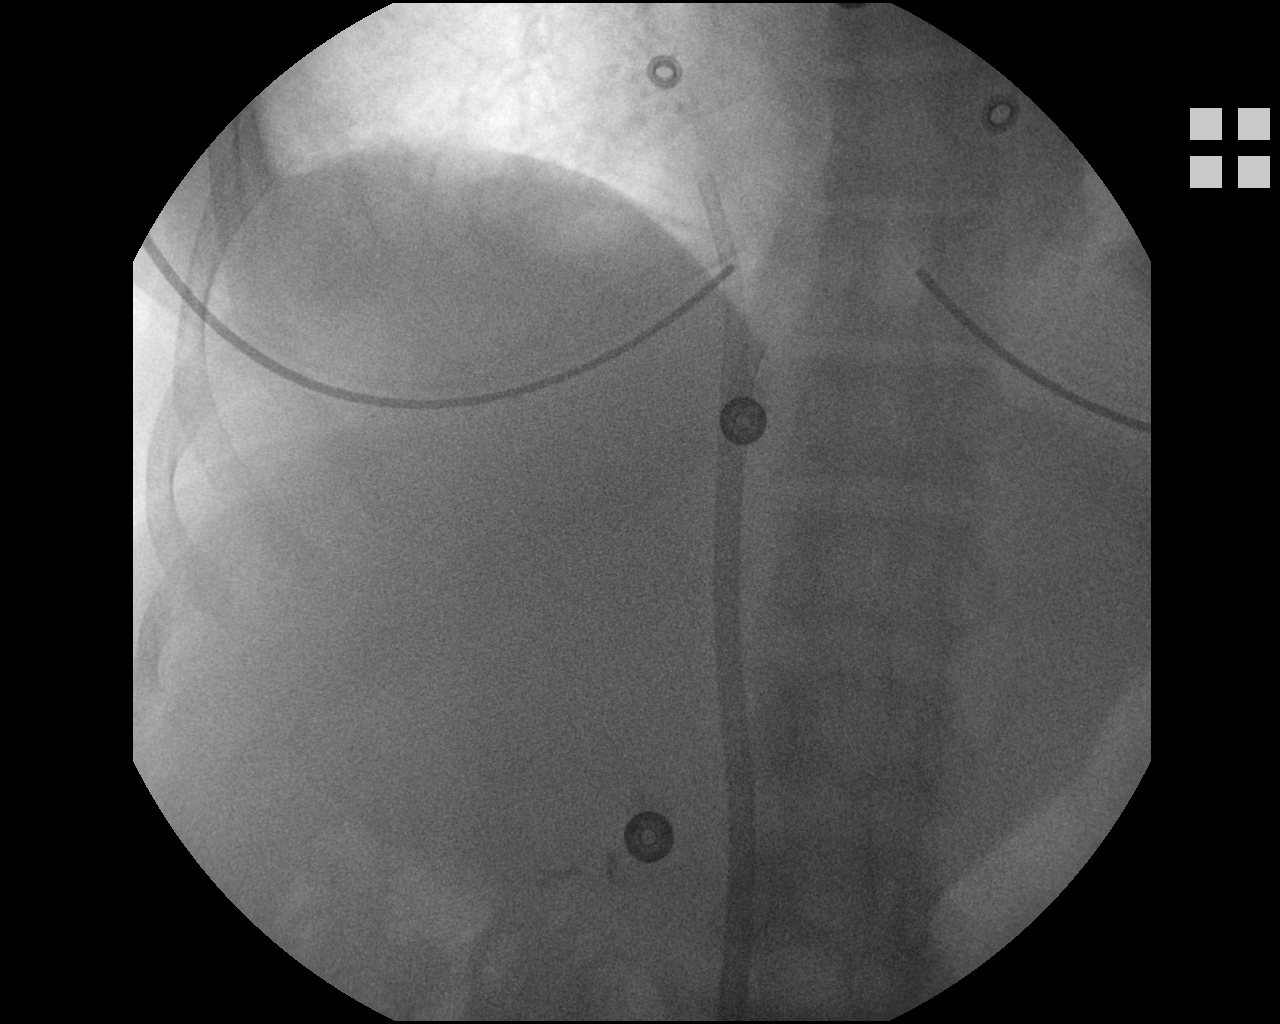
[im 2/4]
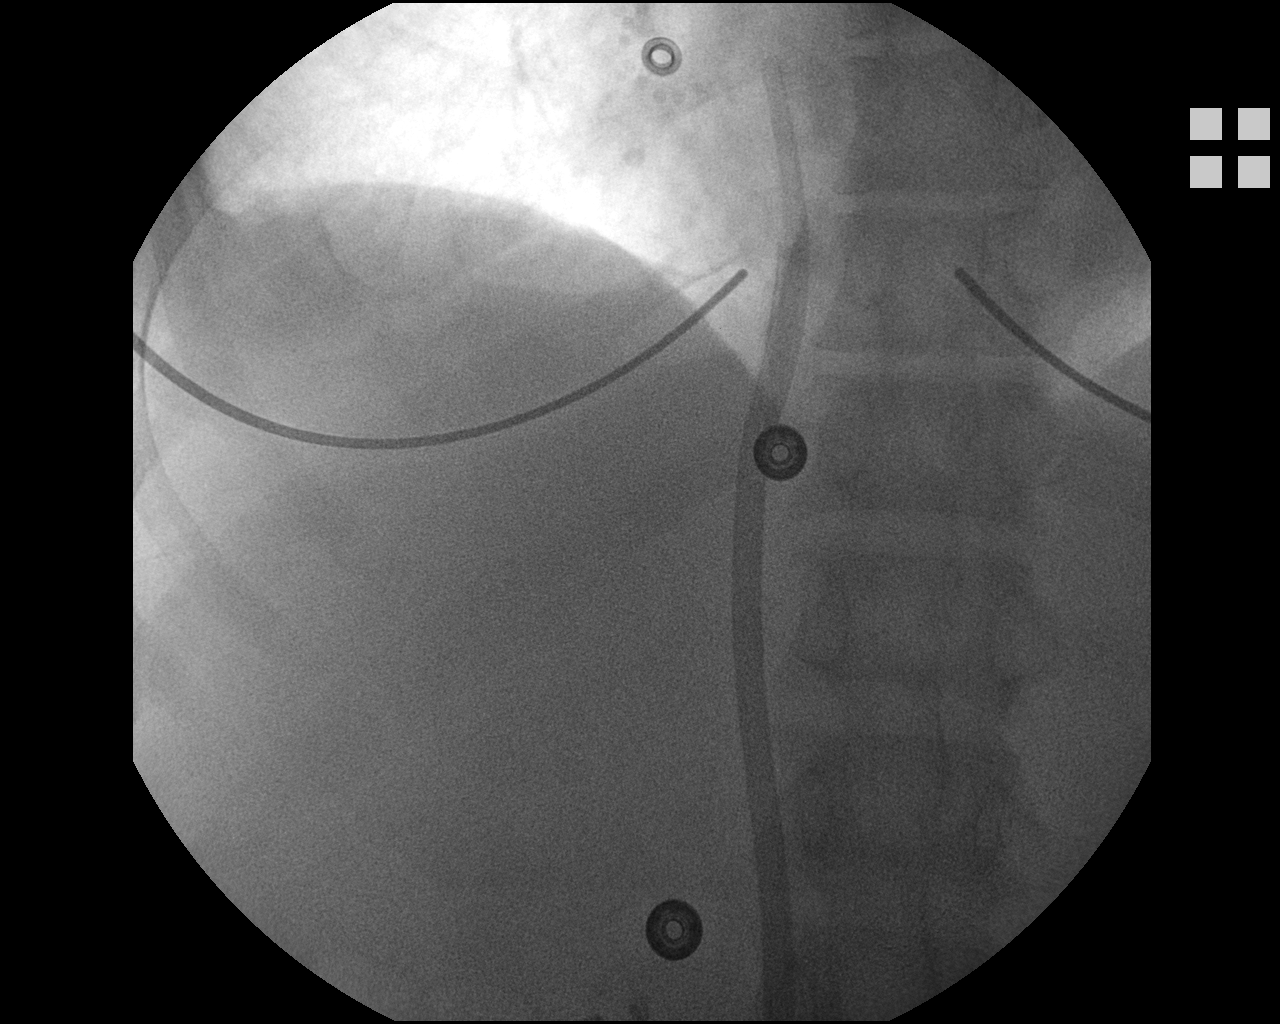
[im 3/4]
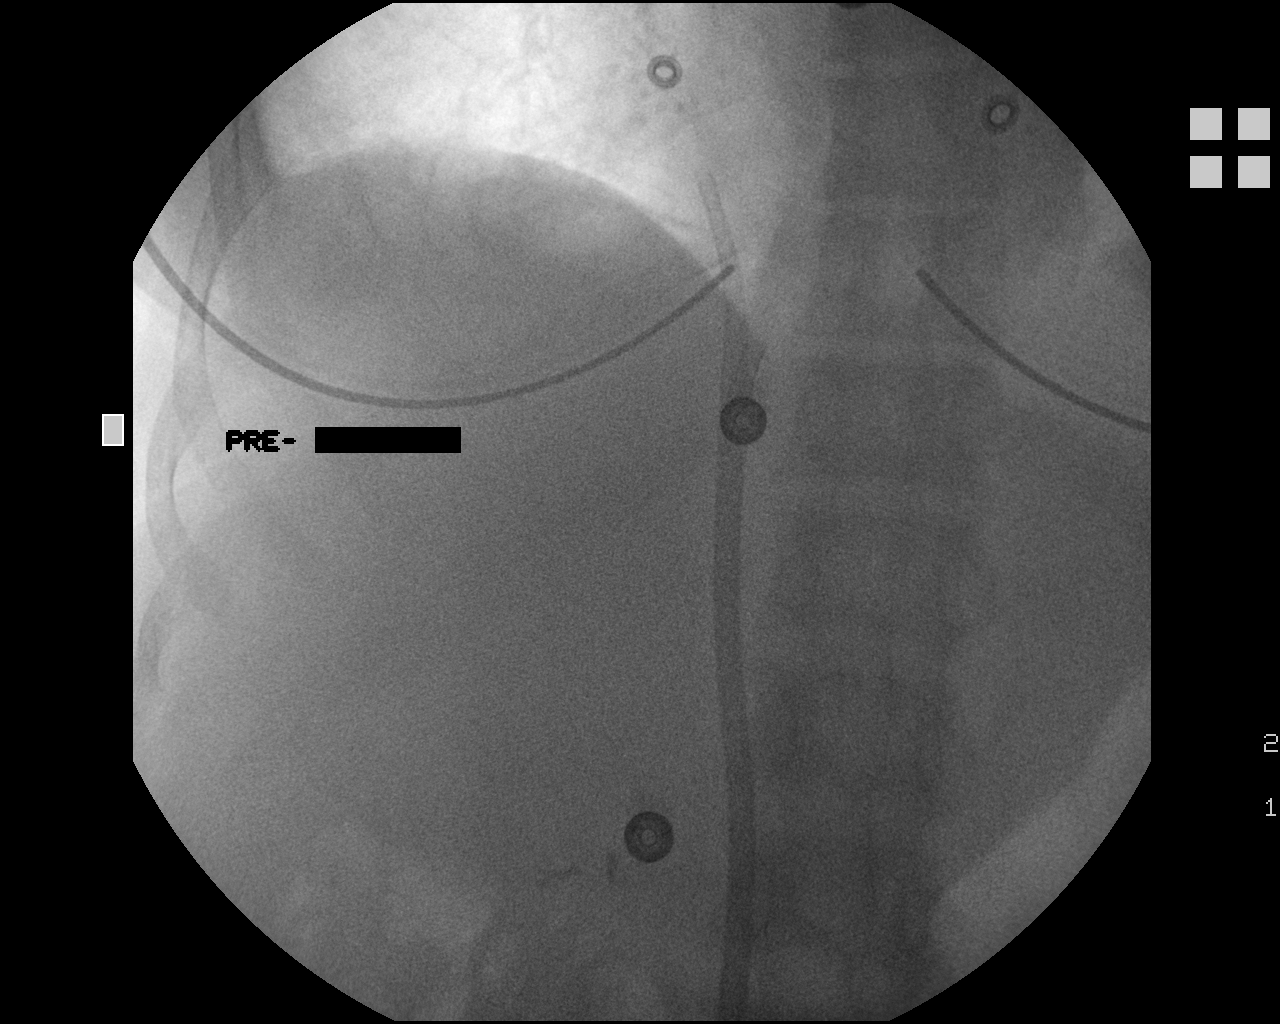
[im 4/4]
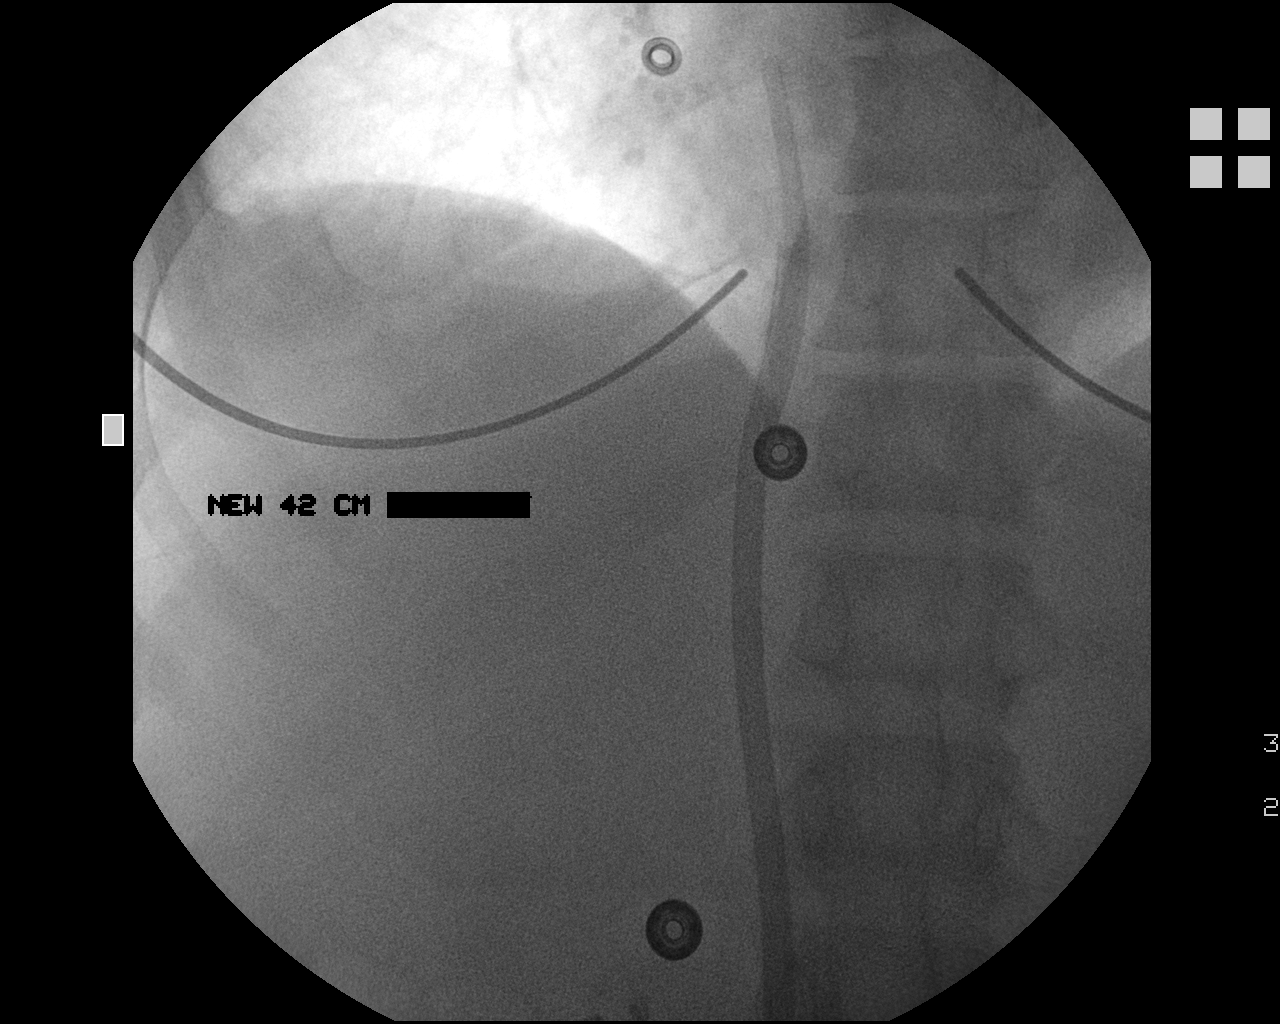

[4 of 4 positions shown; findings below may reference images not displayed]

FINDINGS: Images demonstrate exchange of the tunneled dialysis catheter with the tip at the right atrium.
IMPRESSION: Successful right femoral venous dialysis catheter exchange.

## 2008-10-24 ENCOUNTER — Encounter (INDEPENDENT_AMBULATORY_CARE_PROVIDER_SITE_OTHER): Payer: Self-pay | Admitting: Nephrology

## 2008-10-24 ENCOUNTER — Ambulatory Visit: Payer: Self-pay

## 2009-01-08 ENCOUNTER — Ambulatory Visit (HOSPITAL_COMMUNITY): Admission: RE | Admit: 2009-01-08 | Discharge: 2009-01-09 | Payer: Self-pay | Admitting: Nephrology

## 2009-01-11 ENCOUNTER — Ambulatory Visit (HOSPITAL_COMMUNITY): Admission: RE | Admit: 2009-01-11 | Discharge: 2009-01-11 | Payer: Self-pay | Admitting: Nephrology

## 2009-03-18 ENCOUNTER — Ambulatory Visit: Payer: Self-pay | Admitting: Surgery

## 2009-11-11 ENCOUNTER — Inpatient Hospital Stay (HOSPITAL_COMMUNITY): Admission: AD | Admit: 2009-11-11 | Discharge: 2009-11-20 | Payer: Self-pay | Admitting: Nephrology

## 2009-11-29 DEATH — deceased

## 2010-06-22 ENCOUNTER — Encounter: Payer: Self-pay | Admitting: Nephrology

## 2010-07-23 ENCOUNTER — Encounter: Payer: Self-pay | Admitting: *Deleted

## 2010-08-18 LAB — CBC
HCT: 29.5 % — ABNORMAL LOW (ref 36.0–46.0)
HCT: 29.7 % — ABNORMAL LOW (ref 36.0–46.0)
HCT: 30.4 % — ABNORMAL LOW (ref 36.0–46.0)
HCT: 31.5 % — ABNORMAL LOW (ref 36.0–46.0)
HCT: 33.3 % — ABNORMAL LOW (ref 36.0–46.0)
HCT: 36.2 % (ref 36.0–46.0)
Hemoglobin: 10 g/dL — ABNORMAL LOW (ref 12.0–15.0)
Hemoglobin: 10.5 g/dL — ABNORMAL LOW (ref 12.0–15.0)
Hemoglobin: 10.7 g/dL — ABNORMAL LOW (ref 12.0–15.0)
Hemoglobin: 9.7 g/dL — ABNORMAL LOW (ref 12.0–15.0)
Hemoglobin: 9.8 g/dL — ABNORMAL LOW (ref 12.0–15.0)
MCHC: 32.7 g/dL (ref 30.0–36.0)
MCHC: 32.9 g/dL (ref 30.0–36.0)
MCHC: 33 g/dL (ref 30.0–36.0)
MCHC: 33.3 g/dL (ref 30.0–36.0)
MCHC: 34 g/dL (ref 30.0–36.0)
MCV: 107 fL — ABNORMAL HIGH (ref 78.0–100.0)
MCV: 107.8 fL — ABNORMAL HIGH (ref 78.0–100.0)
MCV: 108 fL — ABNORMAL HIGH (ref 78.0–100.0)
Platelets: 60 10*3/uL — ABNORMAL LOW (ref 150–400)
Platelets: 80 10*3/uL — ABNORMAL LOW (ref 150–400)
Platelets: 85 10*3/uL — ABNORMAL LOW (ref 150–400)
Platelets: 90 10*3/uL — ABNORMAL LOW (ref 150–400)
RBC: 2.75 MIL/uL — ABNORMAL LOW (ref 3.87–5.11)
RBC: 2.91 MIL/uL — ABNORMAL LOW (ref 3.87–5.11)
RBC: 2.96 MIL/uL — ABNORMAL LOW (ref 3.87–5.11)
RBC: 2.97 MIL/uL — ABNORMAL LOW (ref 3.87–5.11)
RDW: 16.6 % — ABNORMAL HIGH (ref 11.5–15.5)
RDW: 16.7 % — ABNORMAL HIGH (ref 11.5–15.5)
RDW: 16.9 % — ABNORMAL HIGH (ref 11.5–15.5)
RDW: 16.9 % — ABNORMAL HIGH (ref 11.5–15.5)
RDW: 17 % — ABNORMAL HIGH (ref 11.5–15.5)
RDW: 17.2 % — ABNORMAL HIGH (ref 11.5–15.5)
WBC: 3 10*3/uL — ABNORMAL LOW (ref 4.0–10.5)
WBC: 3.4 10*3/uL — ABNORMAL LOW (ref 4.0–10.5)
WBC: 3.7 10*3/uL — ABNORMAL LOW (ref 4.0–10.5)
WBC: 3.9 10*3/uL — ABNORMAL LOW (ref 4.0–10.5)
WBC: 4.5 10*3/uL (ref 4.0–10.5)

## 2010-08-18 LAB — RENAL FUNCTION PANEL
Albumin: 3.2 g/dL — ABNORMAL LOW (ref 3.5–5.2)
Albumin: 3.3 g/dL — ABNORMAL LOW (ref 3.5–5.2)
Albumin: 3.3 g/dL — ABNORMAL LOW (ref 3.5–5.2)
Albumin: 3.5 g/dL (ref 3.5–5.2)
Albumin: 3.7 g/dL (ref 3.5–5.2)
Albumin: 3.7 g/dL (ref 3.5–5.2)
BUN: 20 mg/dL (ref 6–23)
BUN: 30 mg/dL — ABNORMAL HIGH (ref 6–23)
BUN: 30 mg/dL — ABNORMAL HIGH (ref 6–23)
CO2: 22 mEq/L (ref 19–32)
CO2: 24 mEq/L (ref 19–32)
CO2: 25 mEq/L (ref 19–32)
CO2: 26 mEq/L (ref 19–32)
Calcium: 10 mg/dL (ref 8.4–10.5)
Calcium: 10.4 mg/dL (ref 8.4–10.5)
Calcium: 10.9 mg/dL — ABNORMAL HIGH (ref 8.4–10.5)
Calcium: 9.7 mg/dL (ref 8.4–10.5)
Chloride: 100 mEq/L (ref 96–112)
Chloride: 101 mEq/L (ref 96–112)
Chloride: 101 mEq/L (ref 96–112)
Chloride: 98 mEq/L (ref 96–112)
Creatinine, Ser: 10.72 mg/dL — ABNORMAL HIGH (ref 0.4–1.2)
Creatinine, Ser: 12.61 mg/dL — ABNORMAL HIGH (ref 0.4–1.2)
Creatinine, Ser: 12.92 mg/dL — ABNORMAL HIGH (ref 0.4–1.2)
GFR calc Af Amer: 3 mL/min — ABNORMAL LOW (ref >60)
GFR calc Af Amer: 4 mL/min — ABNORMAL LOW (ref >60)
GFR calc Af Amer: 4 mL/min — ABNORMAL LOW (ref >60)
GFR calc Af Amer: 5 mL/min — ABNORMAL LOW (ref >60)
GFR calc non Af Amer: 3 mL/min — ABNORMAL LOW (ref >60)
GFR calc non Af Amer: 3 mL/min — ABNORMAL LOW (ref >60)
GFR calc non Af Amer: 3 mL/min — ABNORMAL LOW (ref >60)
GFR calc non Af Amer: 4 mL/min — ABNORMAL LOW (ref >60)
Glucose, Bld: 79 mg/dL (ref 70–99)
Glucose, Bld: 83 mg/dL (ref 70–99)
Glucose, Bld: 89 mg/dL (ref 70–99)
Glucose, Bld: 93 mg/dL (ref 70–99)
Phosphorus: 5.3 mg/dL — ABNORMAL HIGH (ref 2.3–4.6)
Phosphorus: 6 mg/dL — ABNORMAL HIGH (ref 2.3–4.6)
Phosphorus: 7.3 mg/dL — ABNORMAL HIGH (ref 2.3–4.6)
Phosphorus: 9.3 mg/dL (ref 2.3–4.6)
Potassium: 4.3 mEq/L (ref 3.5–5.1)
Potassium: 4.3 mEq/L (ref 3.5–5.1)
Potassium: 4.6 mEq/L (ref 3.5–5.1)
Potassium: 4.7 mEq/L (ref 3.5–5.1)
Sodium: 134 mEq/L — ABNORMAL LOW (ref 135–145)
Sodium: 135 mEq/L (ref 135–145)
Sodium: 137 mEq/L (ref 135–145)
Sodium: 142 mEq/L (ref 135–145)

## 2010-08-18 LAB — DIFFERENTIAL
Basophils Absolute: 0 10*3/uL (ref 0.0–0.1)
Basophils Absolute: 0 10*3/uL (ref 0.0–0.1)
Basophils Absolute: 0 10*3/uL (ref 0.0–0.1)
Basophils Absolute: 0 10*3/uL (ref 0.0–0.1)
Basophils Absolute: 0 10*3/uL (ref 0.0–0.1)
Basophils Absolute: 0 10*3/uL (ref 0.0–0.1)
Basophils Absolute: 0 10*3/uL (ref 0.0–0.1)
Basophils Relative: 0 % (ref 0–1)
Basophils Relative: 1 % (ref 0–1)
Basophils Relative: 1 % (ref 0–1)
Basophils Relative: 1 % (ref 0–1)
Eosinophils Absolute: 0.1 10*3/uL (ref 0.0–0.7)
Eosinophils Absolute: 0.1 10*3/uL (ref 0.0–0.7)
Eosinophils Absolute: 0.1 10*3/uL (ref 0.0–0.7)
Eosinophils Relative: 2 % (ref 0–5)
Eosinophils Relative: 3 % (ref 0–5)
Eosinophils Relative: 3 % (ref 0–5)
Eosinophils Relative: 4 % (ref 0–5)
Eosinophils Relative: 4 % (ref 0–5)
Lymphocytes Relative: 11 % — ABNORMAL LOW (ref 12–46)
Lymphocytes Relative: 11 % — ABNORMAL LOW (ref 12–46)
Lymphocytes Relative: 15 % (ref 12–46)
Lymphocytes Relative: 15 % (ref 12–46)
Lymphocytes Relative: 20 % (ref 12–46)
Lymphocytes Relative: 21 % (ref 12–46)
Lymphs Abs: 0.4 10*3/uL — ABNORMAL LOW (ref 0.7–4.0)
Lymphs Abs: 0.5 10*3/uL — ABNORMAL LOW (ref 0.7–4.0)
Lymphs Abs: 0.5 10*3/uL — ABNORMAL LOW (ref 0.7–4.0)
Monocytes Absolute: 0.4 10*3/uL (ref 0.1–1.0)
Monocytes Absolute: 0.5 10*3/uL (ref 0.1–1.0)
Monocytes Absolute: 0.5 10*3/uL (ref 0.1–1.0)
Monocytes Absolute: 0.5 10*3/uL (ref 0.1–1.0)
Monocytes Relative: 11 % (ref 3–12)
Monocytes Relative: 11 % (ref 3–12)
Monocytes Relative: 12 % (ref 3–12)
Monocytes Relative: 8 % (ref 3–12)
Neutro Abs: 1.9 10*3/uL (ref 1.7–7.7)
Neutro Abs: 2.6 10*3/uL (ref 1.7–7.7)
Neutro Abs: 2.8 10*3/uL (ref 1.7–7.7)
Neutro Abs: 3.4 10*3/uL (ref 1.7–7.7)
Neutrophils Relative %: 68 % (ref 43–77)
Neutrophils Relative %: 69 % (ref 43–77)
Neutrophils Relative %: 71 % (ref 43–77)
Neutrophils Relative %: 78 % — ABNORMAL HIGH (ref 43–77)

## 2010-08-18 LAB — PROTIME-INR
INR: 1.26 (ref 0.00–1.49)
Prothrombin Time: 15.7 seconds — ABNORMAL HIGH (ref 11.6–15.2)

## 2010-08-18 LAB — HEPATIC FUNCTION PANEL
ALT: <8 U/L (ref 0–35)
Bilirubin, Direct: 0.2 mg/dL (ref 0.0–0.3)
Indirect Bilirubin: 0.4 mg/dL (ref 0.3–0.9)

## 2010-08-18 LAB — BASIC METABOLIC PANEL
CO2: 24 mEq/L (ref 19–32)
Glucose, Bld: 90 mg/dL (ref 70–99)
Potassium: 5.7 mEq/L — ABNORMAL HIGH (ref 3.5–5.1)
Sodium: 135 mEq/L (ref 135–145)

## 2010-09-07 LAB — DIFFERENTIAL
Lymphocytes Relative: 25 % (ref 12–46)
Lymphs Abs: 1 10*3/uL (ref 0.7–4.0)
Neutrophils Relative %: 65 % (ref 43–77)

## 2010-09-07 LAB — RENAL FUNCTION PANEL
CO2: 21 mEq/L (ref 19–32)
Chloride: 102 mEq/L (ref 96–112)
GFR calc Af Amer: 3 mL/min — ABNORMAL LOW (ref >60)
GFR calc non Af Amer: 2 mL/min — ABNORMAL LOW (ref >60)
Sodium: 137 mEq/L (ref 135–145)

## 2010-09-07 LAB — CBC
HCT: 31.4 % — ABNORMAL LOW (ref 36.0–46.0)
Platelets: 96 10*3/uL — ABNORMAL LOW (ref 150–400)
WBC: 3.9 10*3/uL — ABNORMAL LOW (ref 4.0–10.5)

## 2010-09-07 LAB — POTASSIUM: Potassium: 3.8 mEq/L (ref 3.5–5.1)
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
## 2010-10-14 NOTE — Assessment & Plan Note (Signed)
OFFICE VISIT   [name], [name]  DOB:  Jun 16, 1970                                       03/18/2009  ZOXWR#:[id number]   REASON FOR EVALUATION:  New dialysis.   HISTORY:  This is a 40 year old female who suffers from lupus which has  been under good control; however, she has been on dialysis for 12 years.  She has had multiple upper arm catheters.  She has had a recent upper  extremity venogram which reveals a central vein occlusion.  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  She has been on and off vancomycin  for the last 2 years.  She recently started this past Saturday secondary  to chills and positive cultures.  She comes in today to discuss new  access.  She has been evaluated at [facility] for transplant and has not yet  progressed to the top of the list.  She also suffers from a history of  deep vein thrombosis as well as pulmonary emboli.  She has had a  peritoneal dialysis catheter in the past but is not using it secondary  to peritonitis.   The patient denies shortness of breath or chest pain.  She has no  neurologic symptoms.   She denies claudication or rest pain in her legs.   PHYSICAL EXAMINATION:  Her blood pressure is 117/84, pulse is 87,  temperature is 97.8.  She is well-appearing, in no acute distress.  Cardiovascular:  Regular rate and rhythm.  Respirations nonlabored.  Extremities are well-perfused.  I cannot feel pedal pulses.  Psych:  She  is alert and oriented x3.  She has palpable femoral pulses bilaterally.   DIAGNOSTIC STUDIES:  I have independently reviewed her ultrasound.  This  reveals noncompressible vessels.  Toe pressures are 72 on the right, 69  on the left.  She has biphasic waveforms in the posterior tibial  arteries bilaterally.   ASSESSMENT:  End-stage renal disease.   PLAN:  Given the patient's recurrent infections, I think we  should  attempt placing a graft in her right thigh.  I will not do this while  she is actively being treated for infection.  She is due to continue her  vancomycin for 2-3 more weeks.  As soon as she comes off of this, she  will contact our office for scheduling of her operation.  She does have  lower extremity arterial insufficiency.  We did discuss the risk of  steal syndrome.  We also discussed the risk of infection.  I think in  order to help alleviate her catheter-related complications, this is her  best option.   Jorge Ny, MD  Electronically Signed   VWB/MEDQ  D:  03/18/2009  T:  03/19/2009  Job:  [id number]   cc:   Jarome Matin, M.D.
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
## 2010-10-14 NOTE — Discharge Summary (Signed)
NAMEVALORIA, [name]           ACCOUNT NO.:  [id number]   MEDICAL RECORD NO.:  [id number]          PATIENT TYPE:  INP   LOCATION:  5532                         FACILITY:  [facility]   PHYSICIAN:  Jarome Matin, M.D.DATE OF BIRTH:  [birth year]   DATE OF ADMISSION:  12/04/2006  DATE OF DISCHARGE:  12/15/2006                               DISCHARGE SUMMARY   ADMITTING DIAGNOSES:  Seizure and subdural hematoma.   DISCHARGE DIAGNOSES:  1. Second subdural hematoma.  2. Seizure.  3. Cellulitis of both feet and legs, with some sepsis.  4. End-stage renal disease, on dialysis.  5. Severe hypertension.   HISTORY AND PHYSICAL AND HOSPITAL COURSE:  [name]  apparently had a seizure and was sent to the emergency room at [facility] and she was seen initially by the neurosurgeon.  Dr. Jeral Fruit  admitted her and this was on her dialysis day and she was not able to be  dialyzed at the unit, so we dialyzed her.  That day, she had her first  subdural hematoma in March, about 4 months ago and we have not used  heparin since then.  We dialyze her with frequent saline flushes about  every half hour and no heparin.  She was dialyzed.   [name] is a 40 year old African American female.  She has been on  hemodialysis over the last 10 years.  She had lupus and lupus end stage  renal disease and hypertension and as I stated the first subdural  hematoma was in March 2008 and she did not have any surgery and her  surgeons chose to watch it resolve on its own and to stop all blood  thinners, no heparin or Coumadin and then elected to do that at this  admission and gradually as we controlled her blood pressure took off  fluid.  Subdural hematoma resolved.  Had significant resolution the last  time they checked it, but as I stated before, she also had cellulitis on  her feet.  First it was on the dorsum of her left foot and we had  started treating it even before admission with Ancef.  So  we continued  to treat it with Ancef.  We did get infectious disease involved and they  added vancomycin.  They decided to keep her on that for at least 2  weeks.  The patient has a history of congestive heart failure in the  past.  She also has had some pulmonary embolism and some  thrombocytopenia and cardiomyopathy.  She lives with her 51 year old  son.  She has a boyfriend who occasionally stays with her.  The  patient's mother lives in [place] but not with her.   PHYSICAL EXAMINATION:  VITAL SIGNS: Temperature of 98.0, a pulse of 79,  respirations 20, blood pressure at this time 150/86.  However, in the  emergency room earlier it was 186/105.  CHEST:  Clear to auscultation and percussion.  At rest, she had rhonchi  in her chest.  EXTREMITIES:  She had pain in her legs and ankles.  She had a rash on  her ankles and the  top of her feet, especially on the left foot which we  previously thought to be a cellulitis.  Patient also has a secondary  hyperparathyroidism with elevated counts and PTH is actually very high  and we have been using Zemplar to help treat the PTH and try to lower  that.   INITIAL LABORATORIES:  Blood cultures initially showed some Staph that  grew out.  It was coag-negative.  The patient's phosphorus has been  extremely high.  Calcium was up to 12 and the phosphorus was 9.2 and  gradually we got the phosphorus lower by stopping PhosLo and them we  were able to use another phosphate iron with no calcium.  On admission  her white blood count was 9.9, hemoglobin 15.3, and we started her on  Ancef and infectious disease added vancomycin and we have been treating  her with the vancomycin and since she has been on the vancomycin for  about a week and we are going to keep her on it for another 10 or 12  days after we have discharged her from the hospital.   MEDICATIONS:  Her other medications, as we stated, we will continue as  an outpatient.  1. Norvasc 10 mg  daily.  2. BiDil one 3 times a day.  3. Altace 10 mg daily.  4. Catapres patch TTS 3 weekly.  5. Nephrovite 1 p.o. daily.  6. We are going to use Aranesp 200 mcg each dialysis for hemoglobin.  7. Zemplar 7 mcg three times at each dialysis Tuesday, Thursday and      Saturday.  8. She has been on the Dilantin 200 mg b.i.d.  I might reduce that to      100 mg three times a day.  9. We are going to keep her on the vancomycin.  We will manage her for      the next few days until we finish 750 mg at the end of each      dialysis.  10.Renagel is the binder that we have been using, 800 mg binder.      Three with each meal and three times a day.  11.Lanoxin 0.125 mg daily.  12.Corgard or Metalol 40 mg daily.  13.All of her blood pressure medications.  14.We are using Tylox or Percocet 5/325 every 6-8 hours for the pain.  15.We are also going to use Ancef 1 gram per dialysis for another      week.  16.Zofran or Phenergan is being used for nausea.  As an outpatient, I      think we are going to use the Phenergan 25 mg.  She can take that      for nausea.  17.Sorbitol solution, constipation.  18.We will used some Atarax 25 mg for itching.  19.Renal vitamins for her.   We are going to discharge the patient home.  Order dialysis on Tuesday,  Thursday, and Saturday morning and we are going to be diligently working  trying to control blood pressure better because that seems to be one of  the triggering factors for her seizures and possible subdural.           ______________________________  Jarome Matin, M.D.     CEF/MEDQ  D:  12/15/2006  T:  12/15/2006  Job:  [id number]

## 2010-10-14 NOTE — Consult Note (Signed)
NAMEJESSICA, [name]           ACCOUNT NO.:  [id number]   MEDICAL RECORD NO.:  [id number]          PATIENT TYPE:  INP   LOCATION:  3105                         FACILITY:  [facility]   PHYSICIAN:  Hilda Lias, M.D.   DATE OF BIRTH:  09-24-1970   DATE OF CONSULTATION:  12/04/2006  DATE OF DISCHARGE:                                 CONSULTATION   HISTORY OF PRESENT ILLNESS:  [name] is a patient who belongs to  the care of Dr. Jeri Cos who has a history of lupus and she is in  chronic renal failure requiring hemodialysis.  This lady several weeks  ago was admitted to the hospital with a small subdural hematoma which  eventually disappeared.  Today she came to the emergency room after she  had a [place] seizure.  In the emergency room she was given  intravenous dilantin.  A CT scan of the head was done which showed a  small subdural hematoma in the right frontal area.  We were called for  evaluation.  Also, Dr. Bascom Levels was called and he is coming to see her.   PHYSICAL EXAMINATION:  GENERAL:  At the present time the patient is a  little bit sleepy.  She is complaining of some mild headache.  She is  following commands really well.  HEENT:  The pupils are 3 mm, equal and reactive.  She has full ocular  movements.  Visual fields are grossly normal.  She is able to swallow.  She is able to move her shoulders without any problems.  She has no  stiffness of the neck and there is no evidence of any head trauma  whatsoever.  STRENGTH:  She moves all 4 extremities.  REFLEXES:  1+.  No Babinski.  SENSATION:  Normal.   The CT scan of the head showed a small subdural hematoma frontal.  There  was no evidence of any shift.   CLINICAL IMPRESSION:  1. A small subdural hematoma.  2. Seizure episode.  3. Chronic renal failure.  4. Lupus.   RECOMMENDATIONS:  The patient's subdural hematoma is small.  There is no  evidence of any shift.  There is no need for any surgical  intervention.  The patient is going to be admitted to the care of Dr. Bascom Levels.  We are  going to follow her up closely.  She is going to be admitted to the  neurosurgical intensive care unit.  Dr. Bascom Levels will be admitting her  and we will be the consultant.  The question about this lady is that  several weeks ago she had the same subdural hematoma and now she has a  new one.  I am going to leave it up to Dr. Bascom Levels to see if he feels  that hematological consult will be useful to try to find out why this  episode of subdural hematoma when there is no evidence of any trauma.  Again, from our point of view we are going to closely observe her.           ______________________________  Hilda Lias, M.D.     EB/MEDQ  D:  12/04/2006  T:  12/04/2006  Job:  [id number]

## 2010-10-17 NOTE — Discharge Summary (Signed)
[place]  Patient:    [name], [name] Visit Number: [id number] MRN: [id number]          Service Type: MED Location: [id number] Attending Physician:  Lynann Bologna Dictated by:   Jarome Matin, M.D. Admit Date:  11/02/2001 Discharge Date: 11/05/2001                             Discharge Summary  ADMISSION DIAGNOSES: 1. Chronic renal failure with missed dialysis dates and fluid overload. 2. Congestive heart failure.  DISCHARGE DIAGNOSES: 1. Chronic renal failure with missed dialysis dates and fluid overload.    Patient with end-stage renal disease. 2. Congestive heart failure. 3. Hypertension. 4. Lupus erythematosus 5. Improved on continuous ambulatory peritoneal dialysis.  BRIEF HISTORY, PHYSICAL AND HOSPITAL COURSE:  This is an admission for this 40 year old African-American female patient who came to the emergency room with markedly increased blood pressure, short of breath, puffiness in her face, obviously fluid overload.  The patient is end-stage renal disease and she is on CAPD.  The patient also has a history of supraventricular tachycardia.  The patient has lupus erythematous and hypertension.  The patient had been getting more and more short of breath and she came to the emergency room.  The patient has a son who lives with her.  Her mother lives in [place] but not with the patient.  She is on CAPD and she is suppose to get monthly labs and show up in the office once a month; however, she missed the past month and we had not seen her in over a month.  The patient states that she has been upset and in a uproar after her grandfathers death.  Physical exam reveals a temperature of 98.7, pulse 102, blood pressure 184/1110.  Respirations 32.  The patient was obviously short of breath. HEENT:  Face was somewhat puffy with fluid overload.  Neck veins were enlarged.  Chest:  She had some bibasilar crackles in both  chest. Abdomen was soft.  No masses.  No organomegaly.  Question of some inguinal nodes.  She could move all of the extremities.  Reflexes were bilaterally equal.  Patient with fluid overload and need to reestablish a new dry weight.  The patients appetite has been poor.  Later in talking with the patient, she admitted that she had been missing her dialysis.  Apparently she had gotten in with a church group and with prayer, supposedly that was going to cure her kidney failure and she had decided at that time to stop dialysis until shortness of breath and fluid overload became so great that she came to the emergency room.  Chest x-ray revealed pulmonary edema.  White count 7.8, hematocrit 27, hemoglobin 9.3.  Sed rate was 40.  PT 58, INR 1.3.  Sodium was 140.  Creatinine was up to 19.1.  BUN was 74.  Calcium 5.5.  So obviously she was not taking her calcium supplement medications nor was she dialyzing herself.  She is very much in need of dialysis.  Natriuretic peptide 2090, very high.  ANA test done and they were negative.  We started the patient on her CAPD in the hospital, using 4.25 liter bags to take off as much fluid as we could.  Gradually we began to take off more weight.  The patients blood pressure started to come down.  She began feeling better and began to eat better.  Blood pressure came down to 125/82, pulse 75, respirations 20, temperature 98.2.  In talking with the patient, she agreed that she may well be depressed and we started her on Zoloft 50 mg daily.  The patient was alert and oriented.  She also stated she did not want to take the Zoloft antidepressant.  She seems very resistant to taking medicine that may well help her but for some reason she has some belief that she can do it all herself.  However, she seems to be showing over and over that she is not. Repeat chest x-ray has no evidence of congestive heart failure.  We have the patient on Altace 2.5 mg and on  Coumadin 2.5 mg.  Corgard, Nadolol 40 mg daily.  EPO 10,000 units Tuesday and Thursday.  PhosLo 3 tabs t.i.d. with meals.  Lanoxin 0.125 mg every other day.  Quinine sulfate 325 mg for six hours p.r.n. cramps.  Phenergan 25 mg p.r.n. for nausea.  The patient is to return to CAPD as before.  Monthly visits to my office and to home training. Dictated by:   Jarome Matin, M.D. Attending Physician:  Lynann Bologna DD:  12/08/01 TD:  12/12/01 Job: [id number]

## 2010-10-17 NOTE — H&P (Signed)
[name], [name]           ACCOUNT NO.:  [id number]   MEDICAL RECORD NO.:  [id number]          PATIENT TYPE:  OIB   LOCATION:  2550                         FACILITY:  [facility]   PHYSICIAN:  Anselm Pancoast. Weatherly, M.D.DATE OF BIRTH:  May 31, 1971   DATE OF ADMISSION:  10/12/2005  DATE OF DISCHARGE:                                HISTORY & PHYSICAL   CHIEF COMPLAINT:  Desires CAPD and has gallstones.   HISTORY:  [name] is a 40 year old female who has been an end-  stage renal disease patient for approximately I think nearly eight years,  and has originally been on hemodialysis, had difficulty with hemodialysis  access, and then I placed a peritoneal dialysis catheter approximately five  years ago, and she did well for approximately nearly five years.  She came  in with significant peritonitis back in October 2006, and Dr. Orson Slick removed  her CAPD catheter.  She had problems with a chronic subphrenic abscess and  everything that kind of gradually resolved after thoracentesis and long-term  antibiotics.  Since then, she has had problems with containing hemodialysis  access, presently is being dialyzed with an Ashe catheter in the right  groin, and Dr. Bascom Levels and she both hope that she can resume peritoneal  dialysis.  She is on a transplant list, but because of the gallstones, we  recommended that we go ahead and do a cholecystectomy, one of which hopes  can be done laparoscopically, and then we will assess whether or not she  possibly would be a candidate for resuming CAPD.  The patient, this morning  when she arrived, had a potassium of 6.2, it is Monday morning.  She was  last dialyzed on Saturday, and short hemodialysis was arranged for this  morning in preparation for surgery on Monday afternoon.  The patient has a  history of lupus.  She is presently not on steroids.  She has had some  elevated glucoses when she was on steroids, but she is not truly a diabetic,  but  she does have a problem with cardiomegaly, has had problems with cardiac  arrhythmias, and is on Lanoxin for this.   CHRONIC MEDICATIONS:  1.  Altace 5 mg daily.  2.  Corgard 40 mg daily.  3.  Calcitriol 1 mg daily.  4.  Lanoxin 0.125 mg daily.  5.  Nephro-Vite daily.  6.  Calcium carbonate t.i.d.  7.  Restoril p.r.n. basis for insomnia.   ALLERGIES:  She lists PREDNISONE and CIPRO she is allergic to.   PAST SURGICAL HISTORY:  1.  She has had multiple grafts, she says eight, both arms, but none are      functioning now.  She has had seven Ashe catheters, and the present one      is in the right groin.  2.  Cesarean section.   FAMILY HISTORY:  Her mother is living, but is a diabetic.  Her father is  living.  Brother is a paraplegic from an accident.  Sister has problems with  peptic ulcer disease.   REVIEW OF SYSTEMS:  She really denies any really acute problems now,  with  the exception that she has the very difficult to keep hemodialysis access.  She denies any fever or signs of infection from the present Ashe catheter.   PHYSICAL EXAMINATION:  GENERAL:  She is a pleasant female, appears her  stated age of 40, in no acute distress.  VITAL SIGNS:  Pulse this morning was 75.  During dialysis she ran from  130/90 to as low as 110/77 during the hemodialysis, and she is afebrile at  97.1.  Said she was having pretty bad cramps at the completion of the  hemodialysis today.  HEENT:  Negative.  LUNGS:  Good breath sounds bilaterally.  CARDIAC:  Normal sinus rhythm.  ABDOMEN:  She has an old scar in the right lower quadrant where she had a  CAPD.  There is a fullness in the right, could not really tell if this was  liver or what, but she is a little bit more firmer on the right than the  left.  She has an Ashe catheter in the right groin.  PELVIC:  I did not do a pelvic examination on her.   LABORATORY DATA:  Electrolytes when she came in was 6.2.  Her hemoglobin  this morning was  14, with a hematocrit of 42, white count was 6100,  electrolytes were normal.  BUN was 95, with a creatinine of 18.6.  Glucose  this morning was 79.   IMPRESSION:  1.  Gallstones.  2.  End-stage renal disease secondary to lupus.  3.  Difficult hemodialysis access because of poor dialysis access.           ______________________________  Anselm Pancoast. Zachery Dakins, M.D.     WJW/MEDQ  D:  10/12/2005  T:  10/12/2005  Job:  [id number]
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
## 2010-10-17 NOTE — Discharge Summary (Signed)
[name], [name]           ACCOUNT NO.:  [id number]   MEDICAL RECORD NO.:  [id number]          PATIENT TYPE:  INP   LOCATION:  5506                         FACILITY:  [facility]   PHYSICIAN:  Anselm Pancoast. Weatherly, M.D.DATE OF BIRTH:  1970/06/19   DATE OF ADMISSION:  10/12/2005  DATE OF DISCHARGE:  10/20/2005                                 DISCHARGE SUMMARY   DATE OF ADMISSION:  Oct 12, 2005   DATE OF DISCHARGE:  Oct 20, 2005   DISCHARGE DIAGNOSES:  1.  Chronic renal failure secondary to lupus.  2.  Chronic cholecystitis.  3.  Extensive intraabdominal adhesions.   OPERATIONS:  Laparoscopic cholecystectomy, exploratory for bleeding right  upper quadrant, hemodialysis.   HISTORY:  [name] is a 40 year old female, end-stage renal  disease, secondary to lupus and she has been on dialysis for approximately 8  years.  I placed a CAPD catheter approximately 5 years ago.  She did well  until she had significant peritonitis back in October of 2006 and Dr. Orson Slick  removed her CAPD catheter.  She had problems with a chronic subphrenic  abscess and eventually this kind of gradually resolved with longterm  antibiotics.  She has then had continued problems with hemodialysis access,  has an Ash catheter in her right groin and Dr. Bascom Levels hopes that she can  resume peritoneal dialysis soon.  She is on a transplant list, but because  of her gallstones we have been recommended that we proceed on with the  cholecystectomy to hope it can be done laparoscopically and then assess  whether or not CAPD could possibly be done in the future.  When the patient  arrived on the morning of Oct 12, 2005, it was a Monday morning.  She had  been hemodialyzed on Saturday and her potassium was 6.2.  She underwent a  hemodialysis, supposedly with low or no heparin, and then was added to the  OR schedule for late Monday afternoon.  When she was taken to surgery, we  were able ultimately to do the  laparoscopic cholecystectomy, but she had  extensive intraabdominal adhesions, very difficult to get into the  peritoneal cavity.  Basically everything was kind of stuck to everything,  but we kept slowly working and did have to lyse significant adhesions for  any type of laparoscopic exposure.  We were able to do the cholecystectomy,  Dr. Jamey Ripa assisted.  At the completion, because of a little bit of kind of  oozy bleeding, we did place a Blake drain and with no active bleeding  determined, basically closed.  In the recovery room she had continued kind  of bloody drainage, not obviously frank blood initially, but kind of like  the irrigating fluid that had been used, but then that became more bleeding.  Dr. Janee Morn was on call that evening and when she went to the floor she had  kind of a drop in her blood pressure and was having obviously bloody active,  what looked like pure blood, from the [place] drains.  Blood was hung.  I was  notified and I came in and basically on  reassessing her, even though her  coags were elevated, I recommended we go ahead and reexplore because of the  degree of bleeding.  She was taken to surgery and a right subcostal incision  was made.  No evidence of any active arterial bleeding was noted and I am  sure this was kind of a combination of the extensive adhesions and also the  low dose of heparin that had been used in the hemodialysis, possibly in that  load, even though I was originally informed that no heparin had been used.  The patient was left on the respiratory that night, I think we finished  surgery about 1:30 a.m., was extubated the following day, and basically had  no further active bleeding from the [facility] drains.  The patient was informed  that really with the extensive adhesions, she was not going to be a  candidate for future CAPD anytime in the near future, regardless of whether  she needed a reexploration or not.  We had hoped to be able to do it  with  the laparoscopy.  She was transfused, I think, 3 units.  Her hematocrit was  37, hemoglobin of 8.  Her chest x-ray the following day was consistent with  congestive heart failure and possibly ARDS and she was dialyzed with marked  improvement on the chest x-ray.  They ran a very tight heparin that day and  did not have any significant increase in the drainage.  Her potassium was  elevated the following day and I think she required another dialysis a  little sooner than usual.  She had been extubated and over the next several  days, kind of slowly improved.  Her diet was advanced.  She was started on  her usual medications and diet on the third postoperative day.  She was  transferred to the floor, I think on the fourth postoperative day.  Wounds  appeared to be healing without evidence of infection and the staples removed  on Oct 19, 2005, and she was ready for discharge after dialysis on Oct 20, 2005.  The patient seemed significantly depressed, as she greatly hoped to  be resume CAPD, which is obviously not going to be possible because of the  extensive intraabdominal adhesions that she has incurred following her  peritonitis.  The laparotomy was not the indication of her being unable to  do CAPD anytime in the near future.           ______________________________  Anselm Pancoast. Zachery Dakins, M.D.     WJW/MEDQ  D:  12/09/2005  T:  12/10/2005  Job:  [id number]   cc:   Jarome Matin, M.D.  Fax: [phone]
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
## 2010-10-17 NOTE — Discharge Summary (Signed)
[place]  Patient:    [name], [name] Visit Number: [id number] MRN: [id number]          Service Type: Attending:  Jarome Matin, M.D. Dictated by:   Jarome Matin, M.D. Adm. Date:  01/09/01 Disc. Date: 01/14/01                             Discharge Summary  ADMISSION DIAGNOSES: 1. Shortness of breath. 2. Supraventricular tachycardia.  DISCHARGE DIAGNOSES: 1. Supraventricular tachycardia. 2. Congestive heart failure. 3. End-stage renal disease. 4. Lupus erythematosus. 5. Anemia of chronic disease, probably related to her kidney disease on CAPD.  HISTORY OF PRESENT ILLNESS:  This is an admission for this 40 year old African-American female, end-stage renal disease patient on CAPD.  She said she started her exchange on the day of admission, and she started having shortness of breath and her heart rapidly racing, and she called the EMTs to her house, and they saw that her rate was at 200.  Their impression was that she was in ventricular tachycardia, but she was alert.  However, she was given 50 joules and subsequently 100, and she continued to have rapid rate until she was given some Adenosine, and she was cardioverted, again she was given Adenosine and she converted to a sinus rhythm.  She was brought to the emergency room, however, being converted while awake ______.  There is a family history of lupus erythematosus.  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  She has been doing fairly well on CAPD.  Her calciums have been very low.  Her potassiums have been low.  Had some trouble keeping her calciums and potassiums up and protein.  Does not seem to get enough protein.  The patient has one son who lives with her, and sometimes lives with her mother.  PHYSICAL EXAMINATION:  VITAL SIGNS:  Temperature 98.2, pulse 101, respiratory rate 28, blood pressure 152/103.  GENERAL:  The patient is alert and  oriented, she was in a sinus rhythm.  She had no chest pain.  HEENT:  Negative.  CHEST:  Clear to auscultation and percussion.  CARDIAC:  She had a regular sinus tachycardia.  LABORATORY DATA:  Cardiac enzymes:  CK was 293, MB was 2.4, troponin was 0.25. Elevated troponins could be related to being cardioverted.  HOSPITAL COURSE:  The patient was admitted.  Potassium was 3.9.  Alkaline phosphatase was 228.  Epogen 10,000 units twice a week, we added that.  Blood pressure was 140/80, pulse 115, respiratory rate 20.  Troponins still around 0.28, still elevated.  Albumin was down low at 2.3.  Dr. Algie Coffer, cardiology, saw the patient, and thought that even though she had normal CK-MBs and troponins, that the cardioversion may have contributed to it.  The 2-D echocardiogram was done.  The patient was found to have a considerably low ejection fraction for a person her age.  Ejection fraction was about 27%.  She continued to retain some fluid.  Lasix in the past, had been stopped since she had been started on dialysis.  We decided to restart Lasix to continue to get rid of fluid.  She seems to respond some.  She was also started on Coreg 3.125 mg b.i.d.  She was put on Coumadin 2.5 mg daily.  She wanted a second cardiac opinion, Dr. Clide Cliff gave, which was similar to Dr. Della Goo.  She had dilated cardiomyopathy, some mitral regurgitation.  The patient had Toprol XL added at 100 mg daily.  Cardizem was discontinued.  It will be used p.r.n. if she has anymore rapid heart rate beats.  Altace was added.  Again, Cardiolite study showed an ejection fraction of 27%, but she had no ischemic areas.  It was thought that she would consider continuing on beta blockers and ACE inhibitors, and use Cardizem if she again had supraventricular tachycardia. If she had more bouts of this, it was suggested that she consider ablation therapy of the lobe.  The patient was discharged to continue with her CAPD  and medications with the additions of Toprol 100 mg q.d. and Altace added.  The patient would go back to home cleaning, and come to my office in about two weeks. Dictated by:   Jarome Matin, M.D. Attending:  Jarome Matin, M.D. DD:  02/03/01 TD:  02/04/01 Job: [id number]
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
## 2010-10-17 NOTE — Consult Note (Signed)
[name], VIRGO           ACCOUNT NO.:  [id number]   MEDICAL RECORD NO.:  [id number]          PATIENT TYPE:  INP   LOCATION:  1823                         FACILITY:  [facility]   PHYSICIAN:  Stefani Dama, M.D.  DATE OF BIRTH:  May 07, 1971   DATE OF CONSULTATION:  08/03/2006  DATE OF DISCHARGE:                                 CONSULTATION   REASON FOR REQUEST:  Subdural hematoma.   HISTORY OF PRESENT ILLNESS:  The patient is a 40 year old right-handed  female with end-stage renal disease on hemodialysis who complains of  headache for 2 weeks' period of time.  She came to the emergency  apartment indicating that her headache was a worse.  Her blood pressure  was noted be severely elevated with a systolic over 200.  A CT scan of  the head demonstrated the presence of a small left subdural hematoma  over the left temporal and parietal regions.  The hematoma measures at  best 2-3 mm in thickness.  There is no evidence of a midline shift.  Patient currently complains of headache.  She is noting that the  headache was worse yesterday.  She has no nausea or vomiting but  complains of severe throbbing.  She has no history of trauma.  She has  no history of any previous similar episodes.  Past medical history is of  course positive for end-stage renal disease.  She has been on dialysis  and she typically gets dialysis on Tuesdays.   PHYSICAL EXAMINATION:  VITAL SIGNS:  Her blood pressure is labile  currently.  With minimal stimulation, her systolic jumps to above 200  while being on Nipride.  HEENT:  Pupils are 3 mm equal and briskly reactive to light and  accommodation.  Extraocular movements are full.  The face symmetric to  grimace.  Tongue and uvula are in the midline.  Sclerae and conjunctivae  are clear.  EXTREMITIES:  There is no evidence of a drift in the upper extremities  and motor strength in both upper and lower extremities appears be intact  to confrontation.  Deep tendon  reflexes are 2+ in the biceps and  triceps, 1+ in the patellae and the Achilles.  Babinskis are downgoing.  NECK:  Supple.  There is no evidence of any bruits on neck.  No masses  are noted.   IMPRESSION:  The patient has a small subdural hematoma on the left side  without any evidence of a shift.  She has end-stage renal disease and  labile hypertension.   Plan at the current time will be for medical management per Dr. Bascom Levels.  Patient is to undergo a repeat CT scan of the brain later on today.  I  would advise against using heparin with the patient's dialysis if at all  possible.  No acute surgical intervention is planned.      Stefani Dama, M.D.  Electronically Signed     HJE/MEDQ  D:  08/03/2006  T:  08/03/2006  Job:  [id number]
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
## 2010-10-17 NOTE — Discharge Summary (Signed)
[name], [name]           ACCOUNT NO.:  [id number]   MEDICAL RECORD NO.:  [id number]          PATIENT TYPE:  INP   LOCATION:  5503                         FACILITY:  [facility]   PHYSICIAN:  Jarome Matin, M.D.DATE OF BIRTH:  February 23, 1971   DATE OF ADMISSION:  08/02/2006  DATE OF DISCHARGE:  08/14/2006                               DISCHARGE SUMMARY   ADMISSION DIAGNOSES:  Intracranial cerebral hemorrhage.   DISCHARGE DIAGNOSES:  1. Subdural hematoma.  2. End-stage renal disease on dialysis.  3. Hypertensive renal disease.  4. Systemic lupus erythematosus.  5. Cardiac arrhythmia.   BRIEF HISTORY AND PHYSICAL AND HOSPITAL COURSE:  The patient is a 40-  year-old female hemodialysis patient who had severe hypertension.  She  was complaining of headache for the past week.  Blood pressure has been  up very high but had not been able to get it down on hemodialysis.  Had  run out of Catapres patch. She was on a TTS-3.  She came to the  emergency room with severe headache.  The patient is on hemodialysis  Tuesday, Thursday, and Saturday.   PHYSICAL EXAMINATION:  VITAL SIGNS: Temperature 98.7, blood pressure  228/132, pulse 66, respirations 16, 94% O2.  HEENT:  Negative except for a headache.  CHEST:  Clear to auscultation and percussion.  HEART:  Regular sinus rhythm.  ABDOMEN:  Soft.  No mass, no organomegaly.  She had active bowel sounds.  EXTREMITIES:  She could move all extremities.  Reflexes bilaterally  equal.  Pulses were equal.   The patient has a history of systemic lupus erythematosus.  She was  hospitalized about a year ago with DIC, and she had some septic clots in  her legs.  She has been hospitalized several times with sepsis.  We got  neurosurgery to see the patient and admitted her to neurosurgical ICU.  CT of her head showed 2-3 cm left subdural hematoma with minimal shift,  complaining of worsening headache.  No nausea or vomiting.  The patient  had been  managed by neurosurgery and decided to try and treat her  medically with lowering of her blood pressure.  The would not use any  heparin in dialysis.  No acute surgical intervention was planned at this  time.  Gradually got her blood pressure down to about 147/88.  Headaches  gradually improved.  Appetite improved.  There were problems with trying  to control her very extremely high blood pressure.  We restarted the  nadolol 40 mg daily.  We had put her on Nipride drip initially to get  her blood pressure down, and dialyzing to continue to try to lower blood  pressure.  Had been using some beta blockers; however, pulse rate kept  dropping.  Heart rate was in the 50s and then got into the 40s.  Temperature was 37 degrees C, pulse 50, blood pressure 140/78.   Gradually the headache improved as we lowered her blood pressure.  We  slowly switched her drip from Nipride drip to Cardene drip.  She  continued to have sinus bradycardia, making it difficult for [place] to use  any beta blockers to help in lowering her blood pressure.  We were able  to control her blood pressure pretty well on the Cardene drip.  Slowly  switched her over to 80-2-2 renal diet.  Were going to use instead of  beta blocker, calcium channel blocker.  Started her on Norvasc 10 mg.  Gradually tried to get her off the Cardene and started her on Altace 20  mg, and her blood pressure was about 150/74, pulse 53, respirations 15.  Chest was fairly clear.  We kept her on a Catapres patch TTS-3.  We  tried to lower her dry weight as much as we could with dialysis.  The  patient's phosphorus started going up, and we increased the Phos-Lo 3  tablets with each meal.  Gradually we did get her off the Cardene drip.  Blood pressure went up to 180/85.   The other problem was we could not use heparin in dialysis because of  the subdural hematoma, so we were using frequent saline flushes every  1/2 hour while on dialysis.  Of course, we had  to be careful to remove  the saline in order to continue to try to lower her blood pressure.  Gradually her headaches improved.  We were not able to get her blood  flow rates over 300, so we had to go back to the interventional  radiologist to look over the films when they put in the leg catheter.  We talked with Dr. Deanne Coffer of radiology, and the catheter was in good  position.  The only thing he thought we could do would be to change it  out.  It was in good position, and we decided not to, and go ahead and  increase her dialysis times and try to dialyses with the catheter since  it had good flow even though we could not get the blood flow rate over  300.  We were able to get her dry weight down to about 68.3 kg.  Post  dialysis, blood pressure was down to 134/71.  Chest was clear.   We were not going to be able to send her out on Coumadin, but we were  going to have to keep her blood pressure down.  The leg catheter was in  good position.  We were doing frequent flushes on dialysis.  Increased  her dialysis time by about 15 minutes.   We discharged her home on:  1. Altace 10 mg daily.  2. Corgard 40 mg daily.  3. Catapres patch TTS-3 each week.  4. Norvasc 10 mg daily.  5. BiDil 1 p.o. 3 times a day.   We were not able to use any beta blockers at this point.  Pulse stayed  in the 50-60 range.  The patient was discharged to home on regular  dialysis medications and blood pressure medicines listed.  She dialyzes  at [facility] on Tuesday, Thursday, and Saturday morning.  Dr. Danielle Dess,  neurosurgery, will see her as an outpatient.           ______________________________  Jarome Matin, M.D.     CEF/MEDQ  D:  09/20/2006  T:  09/20/2006  Job:  [id number]

## 2010-10-17 NOTE — Op Note (Signed)
NAMEAISLYN, [name]           ACCOUNT NO.:  [id number]   MEDICAL RECORD NO.:  [id number]          PATIENT TYPE:  INP   LOCATION:  5527                         FACILITY:  [facility]   PHYSICIAN:  Quita Skye. Hart Rochester, M.D.  DATE OF BIRTH:  1971-02-27   DATE OF PROCEDURE:  03/13/2005  DATE OF DISCHARGE:                                 OPERATIVE REPORT   PREOPERATIVE DIAGNOSIS:  End-stage renal disease, status post removal of  infected peritoneal dialysis catheter.   POSTOPERATIVE DIAGNOSIS:  End-stage renal disease, status post removal of  infected peritoneal dialysis catheter.   PROCEDURE:  1.  Bilateral ultrasound localization, internal jugular veins.  2.  Attempted insertion of Diatek catheter, bilateral internal jugular veins      and left subclavian vein - unsuccessful.  3.  Central venogram.  4.  Insertion of right femoral Diatek catheter (55 cm).   SURGEON:  Quita Skye. Hart Rochester, M.D.   FIRST ASSISTANT:  Nurse.   ANESTHESIA:  Local.   PROCEDURE:  The patient was taken to the operating room and placed in the  supine position, at which time the upper chest and neck were exposed.  Both  internal jugular veins were imaged; both were noted to be quite small and  not normal in appearance.  After prepping and draping in a routine sterile  manner, attempt was made to enter the right internal jugular vein.  A  central vein was entered on the right side of the neck, but a guidewire  would not pass centrally.  A venogram was performed by injecting 20 mL of  contrast through the access needle, which revealed filling of what appeared  to be the innominate or  brachiocephalic vein which then traversed the  mediastinum appeared to enter the left side of the chest rather than the  right side centrally.  There was no innominate vein in the normal  configuration on the right side of the chest.  An attempt was made to enter  the left internal jugular vein and I could not find a vein patent on the  left side and also attempt was made to enter the left subclavian vein, which  was unsuccessful.  Therefore, the groins were prepped with Betadine  scrubbing solution, right common femoral vein was entered percutaneously and  guidewire passed into the right atrium under fluoroscopic guidance.  After  dilating the tract appropriately, the 55-cm Diatek catheter was passed  through peel-away sheath over the guidewire and positioned in the right  atrium at the diaphragm level.  Wire was removed and both ports easily  flushed with heparin and saline.  The catheter was tunneled peripherally and  secured with nylon sutures.  The wound was closed with Vicryl in a  subcuticular fashion, sterile dressing applied and the patient taken to the  recovery room for chest x-ray.          ______________________________  Quita Skye. Hart Rochester, M.D.    JDL/MEDQ  D:  03/13/2005  T:  03/14/2005  Job:  [id number]
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
## 2010-10-17 NOTE — Discharge Summary (Signed)
[place]. [facility]  Patient:    [name], [name]                    MRN: [id number] Adm. Date:  07/10/99 Disc. Date: 07/11/99 Attending:  Jarome Matin, M.D.                           Discharge Summary  ADMISSION DIAGNOSES: 1. Septicemia, probably E. coli. 2. End-stage renal disease. 3. Lupus erythematosus. 4. Replacement of catheter for dialysis.  BRIEF HISTORY HOSPITAL COURSE:  [name] is a 40 year old female on hemodialysis for about nine months.  She had chills and fever for a week.  She ad gone to [place].  She was going to move there and she had been there a month. She was treated at  [facility] and she was dialyzed there.  She did not like the facility and she left; however, it was found up there that she was septic, growing E. coli out of her blood.  She was dismissed from [facility] about a month ago to go to [place] because of noncompliance.  The patient has a child of 82 years old and she said that is was discovered two years prior that she had lupus.  Two weeks ago, in [place], is when they discovered that she was growing out gram-negative rods; however, she dialyzed there and then just left without warning or telling  anyone.  She showed up back down here.  PHYSICAL EXAMINATION:  VITAL SIGNS:  Temperature 99.2, pulse 95, respirations 20, blood pressure 114/25.  GENERAL:  The patient was alert and oriented x 3 in no apparent distress.  HEENT:  Negative.  NECK:  Neck veins were flat.  CHEST:  Clear.  HEART:  Regular sinus rhythm.  ABDOMEN:  Soft.  No masses, no organomegaly.  She had active bowel sounds.  HOSPITAL COURSE:  The patient was somewhat septic.  She had a catheter that was not working and it had been four to five days since she had dialyzed.  The last time was in [place], [place].  The catheter was not working.  We got a call to find ut what she was growing out and everything in  [place], [place].  We put the patient on Ancef; however, the catheter did not work and we had it swapped out in radiology. An Ash catheter was put in, which seemed to work much better.  We dialyzed the patient.  She had a tight superior vena caval stenosis.  The stenosis was opened and she had a new catheter put in.  We kept her on Keflex 500 mg t.i.d.  She was discharged to the [facility]. DD:  08/16/99 TD:  08/18/99 Job: [id number]

## 2010-10-17 NOTE — Discharge Summary (Signed)
[place]. [facility]  Patient:    [name], [name]                    MRN: [id number] Adm. Date:  09/04/99 Disc. Date: 09/09/99 Attending:  Jarome Matin, M.D.                           Discharge Summary  ADMITTING DIAGNOSIS:  Nausea, vomiting, fever, and chills.  DISCHARGE DIAGNOSES: 1. 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 2. 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 3. History of pelvic inflammatory disease and urinary tract infections.  HISTORY OF PRESENT ILLNESS/HOSPITAL COURSE:  This is a 40 year old female with end-stage renal disease who dialyzes at [facility].  She has systemic lupus erythematosus and lupus nephritis.  She has been dialyzing at [facility].  She was sent to [facility] because she has continued to have chills, fever, and nausea.  She had been growing Serratia from her blood back on March 17 and was being treated with tobramycin IV after having started on Levaquin. However, on the day of admission, she developed nausea, vomiting, chills, and fever to 102.  She stated that she felt fine when she was not being dialyzed but, during dialysis, she develops these symptoms.  Patient had had a new Ash catheter on January 12 in [place] done by the radiology in [place], not in [place], and she was having infections.  She had septicemia from [place] growing out gram-negative rods and that had been treated.  That catheter had been removed and the new Ash had been put in by radiology in [place], and she had started to improve until she had some chills and was cultured and grew out the Serratia which was sensitive.  She was started on Levaquin but then switched to tobramycin and was being dialyzed with the new catheter put in here at  [facility].  Patient was admitted, cultured up, and restarted on tobramycin.  She continued to have chills.  The decision was made to change this catheter out in radiology.  This present hemodialysis catheter was removed and a new catheter was put in by radiology.  The patient was continued on tobramycin.  There was some question that the patient may have also had a fungal organism.  I was not sure whether this was a contaminant or not. However, Diflucan was added in order to cover the patient and also p.o. Cipro, and she was continued with pharmacy dosing the tobramycin.  A new Ash catheter was placed on April ______ , and the patient was continued to be dialyzed, and she gradually seemed to improve and feel much better.  Patient was subsequently discharged home on her regular medications for dialysis and also continued on the tobramycin dosage and the Diflucan for the apparent yeast. Of course, the blood cultures in the hospital did not grow out anything because the patient had been on antibiotics, but the new Ash catheter was place and she was discharged to be dialyzed Tuesday, Thursday, and Saturday afternoon at [facility].  Patient felt well and was stable at time of discharge. DD:  09/27/99 TD:  09/28/99 Job: [id number]
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
## 2010-10-17 NOTE — H&P (Signed)
[place]. [facility]  Patient:    DIANY, FORMOSA                  MRN: [id number] Adm. Date:  [id number] Attending:  Toma Copier Dictator:   Remer Macho, M.D.                         History and Physical  CHIEF COMPLAINT:  Nausea, vomiting, fever, and chills.  HISTORY OF PRESENT ILLNESS:  Mrs. Corne is a 40 year old African-American female with end-stage renal disease secondary to systemic lupus erythematosus.  Mrs. Pinkney is a patient of Dr. Bascom Levels.  She was sent over to [facility] today from hemodialysis at [facility] secondary to a known gram-negative rod bacteremia from blood cultures on September 02, 1999.  She had been treated previously for Serratia bacteremia from August 16, 1999, blood cultures with Levaquin.  She had taken the Levaquin from March 20 to August 23, 1999.  At this  point, she was changed to tobramycin.  During hemodialysis today, Mrs. Hickling developed nausea, vomiting, and chills after hemodialysis was initiated.  She also had associated fever with a T-max of 102.2.  The patient reports that she feels fine outside of hemodialysis and only developed symptoms during dialysis.  According to the patient, she had a new Ash catheter placed June 13, 1999, while in [place].  The patient denies any other symptoms at this time.  REVIEW OF SYSTEMS:  Otherwise negative.  PAST MEDICAL HISTORY: 1. End-stage renal disease secondary to biopsy-proven proliferative lupus and    nephritis.  Hemodialysis on Tuesday, Thursday, and Saturday at [facility] was    started July 31, 1997, end diastolic weight 66.5 kg. 2. System lupus erythematosus. 3. Status post C section in 1995. 4. BIlateral tubal ligation in 1995. 5. History of PID. 6. History of UTIs. 7. Macrocytic anemia with hemoglobin 10.2, MCV 102 on August 28, 1999. 8. Hepatitis C negative July 1999. 9. 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  MEDICATIONS: 1. Tobramycin, last dose given today at hemodialysis prior to admission. 2. Levaquin recently discontinued on August 23, 1999. 3. Nephro-Vite 1 p.o. q.d. 4. Epogen, dose unknown. 5. Calcijex, dose unknown.  ALLERGIES:  The patient states that she has had a reaction to SOLU-MEDROL which  involved itching.  SOCIAL HISTORY:  The patient lives in [place].  She has a 2-year-old son and  smokes one-half pack per day, no alcohol abuse.  FAMILY HISTORY:  Grandfather with diabetic nephropathy requiring hemodialysis. No history of coronary artery disease.  PHYSICAL EXAMINATION:  VITAL SIGNS:  T-max 102.8, pulse 124, blood pressure 118/56, respirations 20, weight 67 kg.  GENERAL:  She is an ill-appearing African-American female, alert and oriented x 4, cooperative and pleasant.  HEENT:  Normocephalic, atraumatic.  Pupils are equal, round and reactive to light and accommodation.  Extraocular movements intact.  Anicteric.  Oropharynx: Mucous membranes moist.  No ulcers.  Positive for a tongue ring.  NECK:  Supple.  No masses.  Nontender.  No thyromegaly, no JVD, no bruits.  NODES:  No cervical or inguinal lymphadenopathy.  BACK:  No spinal or CVA tenderness.  CARDIOVASCULAR:  Regular rate and rhythm.  Clear S1, S2.  No murmurs, gallops, r rubs.  PULMONARY/CHEST:  She was clear to auscultation bilaterally with good air movement. Note: The patient had a Ash catheter that was removed by radiology.  Prior to removal, the site was erythematous and  tender.  ABDOMEN:  Soft, nontender, nondistended.  Positive bowel sounds.  No hepatosplenomegaly.  EXTREMITIES:  No edema, nontender, 2+ dorsalis pedis and posterior tibial bilaterally.  SKIN:  Positive for a left upper chest tattoo.  There are no rashes or ulcers.  MUSCULOSKELETAL:  She had good muscle tone throughout.  NEUROLOGIC:  Cranial nerves II-XII were grossly intact.   Sensory was  intact. Strength 5/5 throughout.  Cerebellar: Normal speech and finger-to-nose.  Toes were downgoing bilaterally.  LABORATORY DATA:  She had gram-negative rods in blood cultures from September 02, 1999. Sensitivity is pending.  (Labs were per [employer].)  ASSESSMENT:  Mrs. Gillentine is a 40 year old African-American female with gram-negative rods secondary to an infected Ash catheter.  As noted above, the patient becomes symptomatic primarily during dialysis only.  Her catheter has already been removed thanks to radiology.  As noted above, Mrs. Zuleta was given tobramycin today after hemodialysis.  PLAN: 1. Follow up sensitivity to blood cultures from [facility]. 2. Continue tobramycin and add Cipro 500 mg p.o. q.d. 3. Check blood cultures, urine cultures, and UA. 4. Check a 2-D echocardiogram in the morning to rule out vegetation in the    setting of bacteremia. 5. Treat fever with Tylenol p.r.n. 6. Follow up renal panel in the morning and will hold off on hemodialysis for    now. 7. Will place temporary femoral line if needed for hemodialysis. DD:  09/04/99 TD:  09/04/99 Job: [id number]

## 2010-10-17 NOTE — Op Note (Signed)
[name], [name]           ACCOUNT NO.:  [id number]   MEDICAL RECORD NO.:  [id number]            PATIENT TYPE:   LOCATION:                                 FACILITY:   PHYSICIAN:  Lorre Munroe., M.D.    DATE OF BIRTH:   DATE OF PROCEDURE:  03/11/2005  DATE OF DISCHARGE:                                 OPERATIVE REPORT   PRE-AND-POSTOPERATIVE DIAGNOSIS:  1.  Infected ascites due to infected peritoneal dialysis catheter.  2.  Loculated infected peritoneal collection on the right side of the      abdomen.   OPERATION:  1.  Removal of peritoneal dialysis catheter.  2.  Drainage of fluid from right abdominal fluid collection.   SURGEON:  Lebron Conners, M.D.   ASSISTANT:  Dr. Para March.   ANESTHESIA:  General and local   PROCEDURE:  After the patient was monitored and anesthetized and had routine  preparation and draping of the abdomen, I cut off most of the external  portion of her peritoneal dialysis catheter. I made an incision through an  area which I anesthetized with long-acting local anesthetic slightly  lengthening the previous vertical paramedian incision which had been used  for implantation of the catheter. I then dissected down through the  subcutaneous tissues until I came to the external portion of the catheter  and I divided it; and removed the portion which came out through the skin  and discarded it.   I then dissected with Bovie right along the device down through the anterior  rectus sheath and through the rectus muscle and then very carefully cutting  the fibrous peritoneum and posterior sheath to completely encircle the  button which was inside the peritoneum. I then easily removed the catheter  and it came out intact with the pigtail intact. There was no bleeding from  inside the abdomen. I then dilated the tract slightly with the clamp with my  finger, but did not go into any fluid collection at that point. I placed my  finger through the hole  that I had made; and dissected laterally toward the  fluid collection in the right abdomen which had been identified by the CT  scan. I came into a large fluid collection of cloudy ascites which was thin  and came out easily with suction and removed about 500 mL of fluid.   I placed the patient head down and could not remove any more fluid and felt  that I had adequately drained it. Sponge, needle, and instrument counts were  correct. I again found that hemostasis was good. I added further local  anesthetic and then closed the peritoneum and posterior sheath with running  2-0 Vicryl and closed the anterior sheath with running #0 PDS suture. I  closed the skin with intracuticular 4-0 Vicryl reinforced by Steri-Strips  and applied a bandage. The patient was stable through the procedure.      Lorre Munroe., M.D.  Electronically Signed     WB/MEDQ  D:  03/11/2005  T:  03/11/2005  Job:  [id number]   cc:   Jarome Matin,  M.D.  Fax: [phone]

## 2010-10-17 NOTE — Consult Note (Signed)
[place]. [facility]  Patient:    [name], [name]                  MRN: [id number] Proc. Date: 01/13/01 Adm. Date:  [id number] Disc. Date: [id number] Attending:  Lynann Bologna Dictator:   Chinita Pester, N.P. CC:         Jarome Matin, M.D.  Ricki Rodriguez, M.D.   Consultation Report  REASON FOR CONSULTATION:  Supraventricular tachycardia.  HISTORY OF PRESENT ILLNESS:  This is a 40 year old female with a past medical history of end-stage renal disease secondary to SLE on peritoneal dialysis. She was in her usual state of health until the morning of August 11.  She awoke in the a.m., felt her heart race, became weak and dizzy, and had a complaint of chest pain with shortness of breath.  She called her mother who called EMS.  Upon arrival of EMS, she was found to have a heart rate of 200. They were unable to find a pulse.  The patient remained awake and alert, and she was cardioverted x 1 with no return to sinus rhythm.  She was then given adenosine and DC cardioverted again.  While in the hospital on August 14, she had a 19-beat run of nonsustained ventricular tachycardia, monomorphic, asymptomatic.  States she has had episodes of rapid heart rate in the past starting dialysis but would resolve in a few minutes.  She never had an episode which lasted as long as the one she had for admission.  PAST MEDICAL HISTORY:  End-stage renal disease secondary to SLE, pelvic inflammatory disease, UTIs, macrocytic anemia.  PAST SURGICAL HISTORY:  C section in 1995, bilateral tubal ligation, and Ash catheter in January 2001.  SOCIAL HISTORY:  She lives with her mother.  She has a 48-year-old son. Positive for tobacco, one-half pack per day.  Negative alcohol, negative drug use.  FAMILY HISTORY:  Emelia Loron has diabetic neuropathy and had recovery received hemodialysis.  No CAD.  ALLERGIES:  SOLU-MEDROL and PREDNISONE which cause  itching.  MEDICATIONS: 1. Renal weight calcitriol. 2. PhosLo. 3. Epogen. 4. Lasix 80 daily. 5. Digoxin 0.125 daily. 6. Coumadin 2.5 daily. 7. Toprol XL 50 daily. 8. Cardizem CD 120 daily.  REVIEW OF SYSTEMS:  HEENT: No visual changes.  Cardiovascular: Positive palpitations, positive chest pain, positive shortness of breath.  Respiratory: No dyspnea on exertion.  GI: No nausea, vomiting, diarrhea.  GU: Positive peritoneal dialysis.  LABORATORY DATA:  Cardiolite showed no ischemia with an EF of 27%.  Chest x-ray: Cardiomegaly, no apparent distress.  EKG: Normal sinus rhythm, QTc 485.  EMS strip shows heart rate 210, narrow complex tachycardia.  Telemetry strip on August 14 showed 19-beat run of monomorphic VT.  Sodium 138, potassium 3.6, chloride 102, CO2 25, glucose 82, BUN 62, creatinine 10.7.  Cycle length of nonsustained VT was 480.  WBC 5.6, hemoglobin 9.7, hematocrit 29, platelets 171.  PHYSICAL EXAMINATION:  VITAL SIGNS:  Temperature 98.8, pulse 84, respirations 20, blood pressure 132/81.  GENERAL:  This is a well-developed 40 year old female lying in bed in no apparent distress.  HEENT:  Sclerae clear.  Mucous membranes pink and intact.  NECK:  Right IJ.  Neck is supple.  CARDIOVASCULAR:  Regular rate and rhythm.  Positive S1, S2, no murmur.  CHEST:  Lungs clear to auscultation bilaterally.  ABDOMEN:  Soft, round, nontender.  Normoactive bowel sounds.  EXTREMITIES:  Multiple scarring of arms, no edema.  NEUROLOGIC:  Awake, alert,  and oriented x 3.  ASSESSMENT/PLAN:   As per Dr. Graciela Husbands. 1. Supraventricular tachycardia, sustained isolated episode. 2. Ventricular tachycardia, nonsustained. 3. Nonischemic cardiomyopathy with ejection fraction of 27%, mitral    valve, mitral regurgitation severe, mild mitral stenosis, and    congestive heart failure class III. 4. End-stage renal disease on peritoneal dialysis. 5. Repeated endovascular infections. 6.  Narrow QRS. 7. Systemic lupus erythematosus.  Issues: 1. Resolved supraventricular tachycardia.  Treatment options are a) doing    nothing; b) p.r.n. A-V nodal agents; c) radiofrequency catheter ablation.    The patient and Dr. Graciela Husbands have discussed the above and favor b), p.r.n.    A-V nodal agents. 2. Ventricular tachycardia, nonsustained.  No data that suggest an    intervention with an ICD or ______  beneficial, especially in error    beta blockers and ACE.  RECOMMENDATIONS: 1. Discontinue Cardizem. 2. Include beta blocker. 3. Consider ACE inhibitor. 4. P.R.N. Cardizem 30 mg x 2 thirty minutes apart. 5. Consider radiofrequency ablation as reoccurs. DD:  01/14/01 TD:  01/15/01 Job: [id number]

## 2010-10-17 NOTE — Op Note (Signed)
[place]. [facility]  Patient:    [name], [name]                  MRN: [id number] Proc. Date: 02/11/00 Adm. Date:  [id number] Attending:  Henrene Dodge CC:         Megan Salon., M.D.   Operative Report  PREOPERATIVE DIAGNOSIS:  End-stage renal disease, desires CAPD.  POSTOPERATIVE DIAGNOSIS:  End-stage renal disease, desires CAPD.  OPERATION:  Placement of CAPD catheter; double cuff.  ANESTHESIA:  General.  SURGEON:  Anselm Pancoast. Zachery Dakins, M.D.  HISTORY:  Lusia Greis is a 40 year old black female, a patient of Dr. Leretha Dykes, who has had hemodialysis now for approximately a year and a half. Her chronic renal failure is secondary to lupus.  She has had problems with vascular access, and has failed grafts.  She has had [place] catheter which has given problems with infection.  Dr. Leretha Dykes has asked that I placed a CAPD catheter to see if the patient can use peritoneal dialysis.  The patient has not had any previous abdominal surgeries, except for a cesarean section years ago.  DESCRIPTION OF PROCEDURE:  Preoperatively the patient was given 1 g of vancomycin and the induction of general endotracheal anesthesia was in position.  The abdomen with prepped with Betadine sterile scrubbing solution. She exhibited a lot of loose, adipose tissue as if she has lost a lot of weight.  Sharp dissection down through the skin and subcutaneous tissue.  The anterior rectus fascia was identified; a small vertical incision was made. There was a little bleeder that was sutured with 3-0 chromic.  The underlying rectus muscle was split in the direction with the fibers, exposing the posterior rectus fascia.  Pubis was then picked up between two hemostats and a small opening made.  Three hemostats were placed on the fascia and the peritoneal edges of thick posterior rectus fascia; then the pursestring sutures of 2-0 Vicryl were placed.  The  [place]-type CAPD catheter (right) was placed over a guide wire into the lower abdomen.  The catheter was then positioned with the hemodialysis catheter, and it flushed quite easily.  Next, the peritoneum pursestring sutures were tied.  The fascia was anesthetized with Marcaine for postoperative pain.  Then the rectus muscle was approximated with 3-0 chromic.  The anterior rectus fascia was closed with interrupted sutures of 2-0 Prolene.  The catheter was then tunneled subcutaneously and exited at the right lower quadrant.  The subcutaneous wound was closed with 3-0 chromic, and the skin was closed in interrupted sutures of 5-0 nylon.  A little antibiotic ointment was placed around the exit sites and the incision.  The catheter was hooked up to the connector.  The extension tube and then capped off (it flushes quite easily).  The patient tolerated the procedure nicely.  After a sterile occlusive dressing placed, she was taken to the recovery room in stable postoperative condition.  She will be kept overnight and undergo hemodialysis in the morning.  She should be able to start CAPD in approximately two weeks, after she completes home training. DD:  02/11/00 TD:  02/12/00 Job: [id number]

## 2010-10-17 NOTE — Consult Note (Signed)
[name], [name]           ACCOUNT NO.:  [id number]   MEDICAL RECORD NO.:  [id number]          PATIENT TYPE:  INP   LOCATION:  2104                         FACILITY:  [facility]   PHYSICIAN:  Sydney Cradle. Delford Field, MD, FCCPDATE OF BIRTH:  Jan 09, 1971   DATE OF CONSULTATION:  05/03/2006  DATE OF DISCHARGE:                                 CONSULTATION   CHIEF COMPLAINT:  Evaluate thrombocytopenia.   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  She  currently has a hemodialysis catheter via the right femoral position.  She has had frequent catheter-related infections and medical  nonadherence.  She was admitted on May 02, 2006, because of  progressive shortness of breath and cough productive of thin yellow-  green sputum.  She had no hemoptysis.  She denied any fever but did have  some chills.  She has had no chest pain but has had orthopnea.  Noted  increasing confusion as well.  The last hemodialysis was on April 30, 2006, but was not completed because of hypotension.  She now has  progressive thrombocytopenia, and we are asked to assess this.   PAST MEDICAL HISTORY:  1. Medical history of end-stage renal disease.  Had been on CPD but      because of peritonitis the catheter was removed.  She is now on HD.  2. History of lupus.  3. Hypertension.  4. Abdominal adhesions.  5. Femoral catheter infection, currently on vancomycin via the HD.   PAST SURGICAL HISTORY:  1. Multiple AV grafts.  2. COPD catheter.  3. Laparoscopic cholecystectomy in 2007.  4. Exploratory laparotomy for bleeding in May 2007.   MEDICATIONS PRIOR TO ADMISSION:  1. Altace 5 mg daily.  2. Corgard 40 mg daily.  3. Calcitriol daily.  4. Digoxin 0.125 mg daily.  5. Nephro-Vite daily.  6. Restoril daily.   ALLERGIES:  1. PREDNISONE.  2. CIPROFLOXACIN.   FAMILY HISTORY:  Mother is a diabetic.  Father is a  paraplegic.   PHYSICAL EXAMINATION:  VITAL SIGNS:  His temperature max is 99, blood  pressure 104/50, pulse 114 sinus tachycardia, saturation 94%,  respirations 36.  The patient currently is on an argatroban drip.  CHEST:  Rales at the bases.  CARDIAC:  Regular rate and rhythm without S3.  Normal S1, S2.  ABDOMEN:  Soft and nontender.  EXTREMITIES:  No edema or clubbing.  SKIN:  Clear.  NEUROLOGIC:  Intact.  NECK:  Supple.  No jugular venous distention.  No lymphadenopathy.  HEENT:  Oropharynx was clear.   LABORATORY DATA:  White count 11.9, hemoglobin 13.  Note is made earlier  in the day, white count was 2.4 with a platelet count of 150,000.  Right  now, repeat discrimination shows a white count 11.9, platelet count  25,000 so that the earlier determination made an erroneous.  Sodium 140,  potassium 4.2, chloride 101, CO2 of 23, BUN 37, creatinine 10.6, blood  sugar 100.  Liver functions are elevated, AST 77, ALT 43, alkaline  phosphatase 181, she is status  post cholecystectomy, bilirubin is 4.5,  albumin 2.7, calcium 9.4,  Blood cultures are pending.   Chest x-ray showed interstitial changes, atelectasis and effusions of  the bases but no acute infiltrate.   IMPRESSION:  1. History of lupus with end-stage real disease due to lupus      nephritis.  She likely has severe sepsis with disseminated      intravascular coagulation with a low platelet count, doubt      pulmonary embolism.  2. History of medical noncompliance.  3. She has chronic obstructive pulmonary disease with smoking abuse.  4. History of cardiomyopathy, ejection fraction 27%.  5. She has atelectasis and effusions.  6. Mild hypoxemia.  7. Multiple previous hemodialysis catheter infections.  She may well      have the source of her sepsis now being that of the hemodialysis      catheter.  She has had loose stools, rule out Clostridium      difficile.   RECOMMENDATIONS:  Check DIC panel, consider hematology  consultation.  Obtain a HIB panel.  Follow up blood cultures.  Continue antibiotics per  ID Service.  No indications for ventilation as of yet.  May need to  change HD catheter site.  Will follow with you.      Sydney Cradle Delford Field, MD, [facility]  Electronically Signed     PEW/MEDQ  D:  05/03/2006  T:  05/04/2006  Job:  [id number]   cc:   Fleet Contras, M.D.

## 2010-12-09 ENCOUNTER — Encounter: Payer: Self-pay | Admitting: Cardiology

## 2011-03-02 LAB — BASIC METABOLIC PANEL
GFR calc non Af Amer: 4 — ABNORMAL LOW
Potassium: 4.5
Sodium: 137

## 2011-03-02 LAB — CBC
HCT: 38.1
Hemoglobin: 12.6
Platelets: 125 — ABNORMAL LOW
WBC: 4.4

## 2011-03-04 LAB — CBC
HCT: 31 — ABNORMAL LOW
HCT: 31.5 — ABNORMAL LOW
HCT: 31.8 — ABNORMAL LOW
HCT: 33.5 — ABNORMAL LOW
Hemoglobin: 10.7 — ABNORMAL LOW
Hemoglobin: 10.9 — ABNORMAL LOW
Hemoglobin: 12.5
MCHC: 33
MCHC: 33.6
MCHC: 33.6
MCV: 103.6 — ABNORMAL HIGH
MCV: 103.9 — ABNORMAL HIGH
MCV: 104.1 — ABNORMAL HIGH
MCV: 104.6 — ABNORMAL HIGH
MCV: 105.1 — ABNORMAL HIGH
MCV: 105.9 — ABNORMAL HIGH
Platelets: 113 — ABNORMAL LOW
Platelets: 120 — ABNORMAL LOW
Platelets: 140 — ABNORMAL LOW
Platelets: 97 — ABNORMAL LOW
RBC: 3.18 — ABNORMAL LOW
RBC: 3.2 — ABNORMAL LOW
RDW: 15.8 — ABNORMAL HIGH
RDW: 15.9 — ABNORMAL HIGH
RDW: 15.9 — ABNORMAL HIGH
RDW: 16.1 — ABNORMAL HIGH
RDW: 16.2 — ABNORMAL HIGH
RDW: 16.3 — ABNORMAL HIGH
WBC: 3.2 — ABNORMAL LOW
WBC: 3.2 — ABNORMAL LOW
WBC: 5.8
WBC: 9.1

## 2011-03-04 LAB — COMPREHENSIVE METABOLIC PANEL
AST: 12
AST: 16
AST: 19
Albumin: 3 — ABNORMAL LOW
Albumin: 3.1 — ABNORMAL LOW
Alkaline Phosphatase: 186 — ABNORMAL HIGH
Alkaline Phosphatase: 209 — ABNORMAL HIGH
BUN: 32 — ABNORMAL HIGH
BUN: 72 — ABNORMAL HIGH
CO2: 25
Calcium: 8.6
Chloride: 97
Chloride: 98
Chloride: 98
Creatinine, Ser: 11.49 — ABNORMAL HIGH
Creatinine, Ser: 9.71 — ABNORMAL HIGH
GFR calc Af Amer: 4 — ABNORMAL LOW
GFR calc Af Amer: 5 — ABNORMAL LOW
GFR calc non Af Amer: 5 — ABNORMAL LOW
Potassium: 4
Potassium: 6.5
Total Bilirubin: 0.5
Total Bilirubin: 0.7
Total Bilirubin: 1.1
Total Protein: 6.5
Total Protein: 6.7

## 2011-03-04 LAB — DIFFERENTIAL
Basophils Absolute: 0
Basophils Absolute: 0
Basophils Absolute: 0
Basophils Absolute: 0
Basophils Relative: 0
Basophils Relative: 0
Basophils Relative: 0
Basophils Relative: 0
Eosinophils Absolute: 0
Eosinophils Absolute: 0.1
Eosinophils Absolute: 0.1
Eosinophils Relative: 0
Eosinophils Relative: 2
Eosinophils Relative: 3
Eosinophils Relative: 3
Eosinophils Relative: 5
Lymphocytes Relative: 11 — ABNORMAL LOW
Lymphocytes Relative: 16
Lymphocytes Relative: 23
Lymphocytes Relative: 27
Lymphs Abs: 0.9
Lymphs Abs: 0.9
Lymphs Abs: 1.1
Lymphs Abs: 1.2
Monocytes Absolute: 0.2
Monocytes Absolute: 0.3
Monocytes Absolute: 0.4
Monocytes Absolute: 0.5
Monocytes Absolute: 0.6
Monocytes Absolute: 0.9
Monocytes Relative: 12
Monocytes Relative: 8
Neutro Abs: 1.8
Neutro Abs: 2
Neutro Abs: 2.3
Neutro Abs: 6.6
Neutro Abs: 7.1
Neutrophils Relative %: 69

## 2011-03-04 LAB — LIPID PANEL
Cholesterol: 155
HDL: 31 — ABNORMAL LOW
HDL: 39 — ABNORMAL LOW
LDL Cholesterol: 83
LDL Cholesterol: 95
Total CHOL/HDL Ratio: 4
Total CHOL/HDL Ratio: 4.7
Triglycerides: 166 — ABNORMAL HIGH
Triglycerides: 98
VLDL: 20

## 2011-03-04 LAB — POCT I-STAT 3, ART BLOOD GAS (G3+)
Bicarbonate: 22.4
O2 Saturation: 70
Patient temperature: 99.7
TCO2: 23
pO2, Arterial: 35 — CL

## 2011-03-04 LAB — POCT I-STAT, CHEM 8
Calcium, Ion: 0.97 — ABNORMAL LOW
Chloride: 98
HCT: 41
Hemoglobin: 13.9
TCO2: 23

## 2011-03-04 LAB — RENAL FUNCTION PANEL
BUN: 53 — ABNORMAL HIGH
BUN: 63 — ABNORMAL HIGH
BUN: 64 — ABNORMAL HIGH
CO2: 21
CO2: 25
Calcium: 9
Calcium: 9.2
Calcium: 9.5
Chloride: 98
Creatinine, Ser: 11.4 — ABNORMAL HIGH
Creatinine, Ser: 11.5 — ABNORMAL HIGH
Creatinine, Ser: 12.18 — ABNORMAL HIGH
Creatinine, Ser: 8.9 — ABNORMAL HIGH
GFR calc non Af Amer: 4 — ABNORMAL LOW
Glucose, Bld: 81
Glucose, Bld: 81
Glucose, Bld: 82
Glucose, Bld: 94
Phosphorus: 7.5 — ABNORMAL HIGH
Phosphorus: 7.6 — ABNORMAL HIGH
Potassium: 4.8

## 2011-03-04 LAB — POCT CARDIAC MARKERS: Troponin i, poc: <0.05

## 2011-03-04 LAB — PROTIME-INR: INR: 1.5

## 2011-03-04 LAB — LACTIC ACID, PLASMA: Lactic Acid, Venous: 2.1

## 2011-03-04 LAB — CULTURE, BLOOD (ROUTINE X 2): Culture: NO GROWTH

## 2011-03-09 LAB — I-STAT 8, (EC8 V) (CONVERTED LAB)
Chloride: 105
Glucose, Bld: 87
Hemoglobin: 12.6
Potassium: 4.3
Sodium: 139
pH, Ven: 7.412 — ABNORMAL HIGH

## 2011-03-09 LAB — CBC
MCHC: 32.4
MCV: 94.6
RBC: 3.55 — ABNORMAL LOW
RDW: 18 — ABNORMAL HIGH

## 2011-03-09 LAB — DIFFERENTIAL
Lymphocytes Relative: 32
Lymphs Abs: 1.3
Monocytes Absolute: 0.4
Monocytes Relative: 11
Neutro Abs: 2.2
Neutrophils Relative %: 54

## 2011-03-16 LAB — DIFFERENTIAL
Basophils Absolute: 0
Basophils Relative: 0
Eosinophils Absolute: 0.1
Eosinophils Absolute: 0.1
Eosinophils Relative: 2
Eosinophils Relative: 3
Lymphocytes Relative: 16
Lymphs Abs: 0.8
Monocytes Absolute: 0.4
Monocytes Absolute: 0.5
Monocytes Relative: 11
Neutro Abs: 2.8

## 2011-03-16 LAB — CBC
HCT: 34.5 — ABNORMAL LOW
HCT: 34.8 — ABNORMAL LOW
Hemoglobin: 11.3 — ABNORMAL LOW
Hemoglobin: 11.1 — ABNORMAL LOW
Hemoglobin: 11.3 — ABNORMAL LOW
MCHC: 32.7
MCHC: 32.1
MCV: 96.4
MCV: 93.5
MCV: 95.1
Platelets: 159
RBC: 3.65 — ABNORMAL LOW
RBC: 3.79 — ABNORMAL LOW
RBC: 3.69 — ABNORMAL LOW
RDW: 19.6 — ABNORMAL HIGH
RDW: 19.8 — ABNORMAL HIGH
WBC: 3.6 — ABNORMAL LOW
WBC: 4.4
WBC: 4.7

## 2011-03-16 LAB — RENAL FUNCTION PANEL
BUN: 36 — ABNORMAL HIGH
CO2: 26
Chloride: 101
Creatinine, Ser: 12.42 — ABNORMAL HIGH
Creatinine, Ser: 9.34 — ABNORMAL HIGH
GFR calc Af Amer: 6 — ABNORMAL LOW
GFR calc non Af Amer: 5 — ABNORMAL LOW
Glucose, Bld: 75
Glucose, Bld: 77
Phosphorus: 5.7 — ABNORMAL HIGH
Potassium: 5.5 — ABNORMAL HIGH

## 2011-03-16 LAB — BASIC METABOLIC PANEL
BUN: 28 — ABNORMAL HIGH
CO2: 22
Chloride: 101
Chloride: 102
Creatinine, Ser: 10.97 — ABNORMAL HIGH
GFR calc Af Amer: 3 — ABNORMAL LOW
GFR calc Af Amer: 5 — ABNORMAL LOW
GFR calc non Af Amer: 4 — ABNORMAL LOW
Glucose, Bld: 78
Potassium: 3.8
Potassium: 4.1
Sodium: 140

## 2011-03-16 LAB — PROTIME-INR: Prothrombin Time: 14.5

## 2011-03-17 LAB — RENAL FUNCTION PANEL
Albumin: 2.6 — ABNORMAL LOW
Albumin: 2.7 — ABNORMAL LOW
Albumin: 2.8 — ABNORMAL LOW
BUN: 62 — ABNORMAL HIGH
CO2: 21
CO2: 22
CO2: 22
CO2: 24
CO2: 26
Calcium: 7.9 — ABNORMAL LOW
Calcium: 8.7
Chloride: 100
Chloride: 100
Chloride: 98
Chloride: 98
Chloride: 98
Creatinine, Ser: 10.44 — ABNORMAL HIGH
Creatinine, Ser: 13.3 — ABNORMAL HIGH
Creatinine, Ser: 16.11 — ABNORMAL HIGH
GFR calc Af Amer: 3 — ABNORMAL LOW
GFR calc Af Amer: 4 — ABNORMAL LOW
GFR calc Af Amer: 4 — ABNORMAL LOW
GFR calc Af Amer: 5 — ABNORMAL LOW
GFR calc Af Amer: 5 — ABNORMAL LOW
GFR calc Af Amer: 6 — ABNORMAL LOW
GFR calc Af Amer: 8 — ABNORMAL LOW
GFR calc non Af Amer: 3 — ABNORMAL LOW
GFR calc non Af Amer: 3 — ABNORMAL LOW
GFR calc non Af Amer: 3 — ABNORMAL LOW
GFR calc non Af Amer: 4 — ABNORMAL LOW
GFR calc non Af Amer: 4 — ABNORMAL LOW
GFR calc non Af Amer: 5 — ABNORMAL LOW
GFR calc non Af Amer: 6 — ABNORMAL LOW
Phosphorus: 4.7 — ABNORMAL HIGH
Phosphorus: 4.8 — ABNORMAL HIGH
Phosphorus: 9.2 — ABNORMAL HIGH
Potassium: 3.8
Potassium: 4.4
Potassium: 4.6
Potassium: 5.9 — ABNORMAL HIGH
Sodium: 131 — ABNORMAL LOW
Sodium: 136
Sodium: 136
Sodium: 137

## 2011-03-17 LAB — DIFFERENTIAL
Basophils Absolute: 0
Basophils Absolute: 0
Basophils Absolute: 0
Basophils Absolute: 0
Basophils Relative: 0
Basophils Relative: 0
Basophils Relative: 0
Basophils Relative: 0
Basophils Relative: 0
Eosinophils Absolute: 0.1
Eosinophils Absolute: 0.1
Eosinophils Relative: 1
Eosinophils Relative: 1
Eosinophils Relative: 1
Eosinophils Relative: 2
Eosinophils Relative: 3
Eosinophils Relative: 3
Lymphocytes Relative: 11 — ABNORMAL LOW
Lymphocytes Relative: 11 — ABNORMAL LOW
Lymphocytes Relative: 17
Lymphocytes Relative: 17
Lymphocytes Relative: 18
Lymphocytes Relative: 19
Lymphocytes Relative: 20
Lymphs Abs: 0.7
Lymphs Abs: 0.8
Lymphs Abs: 0.8
Lymphs Abs: 1
Monocytes Absolute: 0.5
Monocytes Absolute: 0.6
Monocytes Absolute: 0.6
Monocytes Absolute: 0.8 — ABNORMAL HIGH
Monocytes Relative: 10
Monocytes Relative: 12 — ABNORMAL HIGH
Neutro Abs: 3.4
Neutro Abs: 3.8
Neutro Abs: 4.3
Neutrophils Relative %: 64
Neutrophils Relative %: 65
Neutrophils Relative %: 70
Neutrophils Relative %: 75

## 2011-03-17 LAB — CBC
HCT: 32.8 — ABNORMAL LOW
HCT: 34 — ABNORMAL LOW
HCT: 35.8 — ABNORMAL LOW
HCT: 36.5
HCT: 37.8
HCT: 41.7
Hemoglobin: 10.6 — ABNORMAL LOW
Hemoglobin: 11.2 — ABNORMAL LOW
Hemoglobin: 13.3
MCHC: 32
MCHC: 32
MCHC: 32
MCHC: 32.2
MCV: 95
MCV: 96.7
MCV: 97.1
Platelets: 114 — ABNORMAL LOW
Platelets: 140 — ABNORMAL LOW
Platelets: 150
Platelets: 205
Platelets: 222
Platelets: 240
Platelets: 241
RBC: 3.45 — ABNORMAL LOW
RBC: 3.54 — ABNORMAL LOW
RBC: 3.68 — ABNORMAL LOW
RBC: 3.78 — ABNORMAL LOW
RDW: 18.5 — ABNORMAL HIGH
RDW: 18.5 — ABNORMAL HIGH
RDW: 18.7 — ABNORMAL HIGH
RDW: 18.8 — ABNORMAL HIGH
RDW: 19.1 — ABNORMAL HIGH
WBC: 3.7 — ABNORMAL LOW
WBC: 4.4
WBC: 5.1
WBC: 5.4
WBC: 8

## 2011-03-17 LAB — CK TOTAL AND CKMB (NOT AT ARMC)
CK, MB: 0.9
Relative Index: 0.5
Total CK: 170

## 2011-03-17 LAB — PHENYTOIN LEVEL, TOTAL: Phenytoin Lvl: 7.5 — ABNORMAL LOW

## 2011-03-17 LAB — CULTURE, BLOOD (ROUTINE X 2)
Culture: NO GROWTH
Culture: NO GROWTH

## 2011-03-17 LAB — POCT I-STAT CREATININE
Creatinine, Ser: 12.8 — ABNORMAL HIGH
Operator id: 192351

## 2014-04-27 ENCOUNTER — Emergency Department (HOSPITAL_COMMUNITY)
Admission: EM | Admit: 2014-04-27 | Discharge: 2014-04-27 | Disposition: A | Discharge status: Home / Self Care | Payer: Self-pay | Attending: Emergency Medicine | Admitting: Emergency Medicine

## 2014-04-27 ENCOUNTER — Encounter (HOSPITAL_COMMUNITY): Payer: Self-pay

## 2014-04-27 DIAGNOSIS — R079 Chest pain, unspecified: Secondary | ICD-10-CM

## 2014-04-27 NOTE — ED Notes (Deleted)
Patient is coming by private vehicle from home. Patient was preparing dinner when she started to have heaviness, sharp pain in her left chest that radiates to the left arm and jaw. Patient complains of dizziness, nausea, and vomiting. Patient has a history of the same. Patient is Oxygen Dependent at home with 2 L [place]. Patient alert and oriented upon arrival yto the room. Patient is able to speak in complete sentences.
